# Patient Record
Sex: Female | Born: 1935 | Race: White | Hispanic: No | Smoking: Never smoker
Health system: Southern US, Community
[De-identification: ages and names within clinical notes are randomized; demographics above are authoritative.]

## PROBLEM LIST (undated history)

## (undated) ENCOUNTER — Emergency Department (HOSPITAL_COMMUNITY): Admission: EM | Payer: Medicare Other | Source: Home / Self Care

## (undated) DIAGNOSIS — E079 Disorder of thyroid, unspecified: Secondary | ICD-10-CM

## (undated) DIAGNOSIS — I10 Essential (primary) hypertension: Secondary | ICD-10-CM

## (undated) DIAGNOSIS — C801 Malignant (primary) neoplasm, unspecified: Secondary | ICD-10-CM

## (undated) DIAGNOSIS — N200 Calculus of kidney: Secondary | ICD-10-CM

## (undated) DIAGNOSIS — F419 Anxiety disorder, unspecified: Secondary | ICD-10-CM

## (undated) DIAGNOSIS — J302 Other seasonal allergic rhinitis: Secondary | ICD-10-CM

## (undated) DIAGNOSIS — M1990 Unspecified osteoarthritis, unspecified site: Secondary | ICD-10-CM

## (undated) DIAGNOSIS — R42 Dizziness and giddiness: Secondary | ICD-10-CM

## (undated) HISTORY — DX: Unspecified osteoarthritis, unspecified site: M19.90

## (undated) HISTORY — PX: GALLBLADDER SURGERY: SHX652

## (undated) HISTORY — DX: Calculus of kidney: N20.0

## (undated) HISTORY — DX: Anxiety disorder, unspecified: F41.9

## (undated) HISTORY — DX: Other seasonal allergic rhinitis: J30.2

## (undated) HISTORY — DX: Disorder of thyroid, unspecified: E07.9

## (undated) HISTORY — DX: Dizziness and giddiness: R42

## (undated) HISTORY — PX: WRIST FRACTURE SURGERY: SHX121

## (undated) HISTORY — DX: Essential (primary) hypertension: I10

## (undated) HISTORY — PX: OTHER SURGICAL HISTORY: SHX169

---

## 2007-03-02 ENCOUNTER — Emergency Department (HOSPITAL_COMMUNITY): Admission: EM | Admit: 2007-03-02 | Discharge: 2007-03-02 | Payer: Self-pay | Admitting: Emergency Medicine

## 2011-05-03 ENCOUNTER — Inpatient Hospital Stay (HOSPITAL_COMMUNITY)
Admission: EM | Admit: 2011-05-03 | Discharge: 2011-05-05 | DRG: 512 | Disposition: A | Discharge status: Home / Self Care | Payer: Medicare Other | Attending: Orthopedic Surgery | Admitting: Orthopedic Surgery

## 2011-05-03 ENCOUNTER — Emergency Department (HOSPITAL_COMMUNITY): Payer: Medicare Other

## 2011-05-03 DIAGNOSIS — E039 Hypothyroidism, unspecified: Secondary | ICD-10-CM | POA: Diagnosis present

## 2011-05-03 DIAGNOSIS — W19XXXA Unspecified fall, initial encounter: Secondary | ICD-10-CM | POA: Diagnosis present

## 2011-05-03 DIAGNOSIS — S52599A Other fractures of lower end of unspecified radius, initial encounter for closed fracture: Principal | ICD-10-CM | POA: Diagnosis present

## 2011-05-03 DIAGNOSIS — S52609B Unspecified fracture of lower end of unspecified ulna, initial encounter for open fracture type I or II: Secondary | ICD-10-CM | POA: Diagnosis present

## 2011-05-03 LAB — BASIC METABOLIC PANEL
CO2: 27 mEq/L (ref 19–32)
Calcium: 10.3 mg/dL (ref 8.4–10.5)
Creatinine, Ser: 1.33 mg/dL — ABNORMAL HIGH (ref 0.4–1.2)
GFR calc non Af Amer: 37 mL/min — ABNORMAL LOW (ref >60)
Sodium: 135 mEq/L (ref 135–145)

## 2011-05-03 LAB — DIFFERENTIAL
Basophils Relative: 0 % (ref 0–1)
Eosinophils Absolute: 0 10*3/uL (ref 0.0–0.7)
Eosinophils Relative: 0 % (ref 0–5)
Lymphs Abs: 1.7 10*3/uL (ref 0.7–4.0)
Monocytes Absolute: 0.4 10*3/uL (ref 0.1–1.0)
Monocytes Relative: 5 % (ref 3–12)

## 2011-05-03 LAB — CBC
MCH: 31.5 pg (ref 26.0–34.0)
MCHC: 33.2 g/dL (ref 30.0–36.0)
MCV: 94.9 fL (ref 78.0–100.0)
Platelets: 224 10*3/uL (ref 150–400)
RBC: 3.55 MIL/uL — ABNORMAL LOW (ref 3.87–5.11)
RDW: 13.1 % (ref 11.5–15.5)

## 2011-05-04 ENCOUNTER — Inpatient Hospital Stay (HOSPITAL_COMMUNITY): Payer: Medicare Other

## 2011-05-04 DIAGNOSIS — S52209A Unspecified fracture of shaft of unspecified ulna, initial encounter for closed fracture: Secondary | ICD-10-CM

## 2011-05-04 DIAGNOSIS — S52539A Colles' fracture of unspecified radius, initial encounter for closed fracture: Secondary | ICD-10-CM

## 2011-05-04 LAB — SURGICAL PCR SCREEN: MRSA, PCR: NEGATIVE

## 2011-05-06 NOTE — H&P (Signed)
NAME:  Amanda Prince, Amanda Prince                ACCOUNT NO.:  [id number]  MEDICAL RECORD NO.:  [id number]  LOCATION:  [facility]                          FACILITY:  [facility]  PHYSICIAN:  Vickki Hearing, M.D.DATE OF BIRTH:  1917-06-22  DATE OF ADMISSION:  05/03/2011 DATE OF DISCHARGE:  LH                             HISTORY & PHYSICAL   CHIEF COMPLAINT:  Pain, right wrist.  HISTORY:  This is a 77 year old female, tripped and fell earlier this evening fracturing her right distal radius.  There was a puncture wound over the ulna suggesting open fracture.  There is deformity of the right wrist.  Radiographs show a comminuted fracture of the distal radius with an ulnar fracture and significant displacement and impaction.  The history is notable for history of arthritis, gallstones, hypothyroidism, kidney stones, vertigo.  SOCIAL HISTORY:  Benign.  Family history of hypertension.  SURGICAL HISTORY:  Kidney stones and cholecystectomy.  MEDICINES:  Diovan, Synthroid, vitamin B6 and 12, Advil, loratadine, meclizine, Ativan, Allegra.  She has a primary care physician in [place].  She lives alone.  REVIEW OF SYSTEMS:  She says she has been healthy, does not go to the doctor that often and feels fine.  She is active at home.  PHYSICAL EXAMINATION:  VITAL SIGNS:  Temperature 98, pulse 84, respiratory rate 18, initial blood pressure is 161/67, suggesting high systolic blood pressure secondary to pain. GENERAL:  She is small-frame, well developed and well nourished, grooming and hygiene are normal. CARDIOVASCULAR:  Also normal. She has good perfusion to the right hand, good color and capillary refill are noted.  Normal radial and ulnar pulses are noted. SKIN:  There is a puncture wound over the ulna suggesting open fracture, although no bone is protruding. LYMPH SYSTEM:  Apparently negative with no obvious lymphangitis or palpable lymph nodes. NEUROLOGIC:  She is awake, alert and oriented.  Mood  and affect are normal.  She has normal gross sensory exam.  No focal findings has some numbness and tingling in the hand, but no evidence of nerve injury or carpal tunnel syndrome or compression.  No other injuries are noted, the other extremities are normally aligned without contracture, subluxation, atrophy, or tremor.  The right wrist is severely deformed.  There was a puncture wound over the ulna.  There is abnormal joint motion with pain.  There is crepitance and palpable tenderness at the fracture site.  Muscle tone is normal.  Elbow and shoulder joint subluxation none.  X-rays show a comminuted intra-articular fracture of the distal radius, fracture of the distal ulna, impaction osteopenia, suggesting osteoporosis, severe deformity.  In the emergency room, we did a hematoma block with 10 mL of plain 1% lidocaine.  We did a closed reduction.  We placed a sugar-tong splint. We dressed the wound with a sterile dressing.  She was started on Ancef 1 g.  We will continue that q.8 hours overnight.  I have indicated that she will need open treatment internal fixation of the wrist fracture, possible pinning or plating of the ulna fracture and also will require incision and drainage and irrigation debridement of the ulna wound.  Her initial lab counts are hemoglobin 11.2, platelet  count 224.  Sodium 135, potassium 4.4, glucose 113, BUN 17, creatinine 1.33.  She had a EKG which showed normal sinus rhythm.  She has a chest x-ray pending.  She will be admitted, continued on IV antibiotics, and have surgery in the morning.  The surgery was explained to the patient.  I do not think there is an option of close treatment with this severely unstable fracture pattern and the wound and they agree, informed consent was done in the ER with the family members present.     Vickki Hearing, M.D.     SEH/MEDQ  D:  05/03/2011  T:  05/03/2011  Job:  [id number]  Electronically Signed by  Fuller Canada M.D. on 05/06/2011 12:52:35 PM

## 2011-05-06 NOTE — Op Note (Signed)
Amanda Prince, Amanda Prince                ACCOUNT NO.:  [id number]  MEDICAL RECORD NO.:  [id number]  LOCATION:  A307                          FACILITY:  [facility]  PHYSICIAN:  Vickki Hearing, M.D.DATE OF BIRTH:  21-Oct-1917  DATE OF PROCEDURE: DATE OF DISCHARGE:                              OPERATIVE REPORT   DATE OF INJURY:  May 03, 2011.  PREOPERATIVE DIAGNOSES: 1. Closed fracture, right distal radius. 2. Open fracture, right distal ulna, grade 1.  POSTOPERATIVE DIAGNOSES: 1. Closed fracture, right distal radius. 2. Open fracture, right distal ulna, grade 1.  PROCEDURES: 1. Open treatment internal fixation, right distal radius with DVR     DePuy volar plate. 2. Incision and drainage, irrigation debridement, right distal ulna.  SURGEON:  Vickki Hearing, MD  ASSISTANTS:  None.  ANESTHESIA:  General with intubation.  OPERATIVE FINDINGS: 1. Comminuted, displaced, angulated, impacted right distal radius     fracture with osteopenia. 2. Inside out puncture wound, distal ulna with fracture, angulation,     and displacement.  This 77 year old female fell on May 03, 2011, injured her wrist, had severe deformity.  She had a closed reduction in the emergency room, wound was cleaned and treated.  She was started on antibiotics, it was an inside out puncture wound and delayed treatment was deemed appropriate.  She was brought to the operating room for general anesthesia.  She was already on Ancef 1 g q.8 hours.  She had the general anesthetic with slight difficulty which did require fiberoptic intubation and then smooth intubation was performed.  Her right arm was prepped with Betadine, draped sterilely.  Time-out was completed.  First, a closed reduction was attempted and radiographs confirmed that the fracture could be reduced with indirect means.  We then elevated the tourniquet to 250 mmHg where it stayed for 65 minutes.  A 2-0 K-wire was placed from the radial  styloid to the radial shaft. Repeat films showed the fracture to be in good position.  Volar approach to the distal radius was performed.  Subcutaneous tissue was divided. FCR sheath was opened.  FCR tendon was retracted ulnarly.  Blunt dissection was carried down to the pronator quadratus which was elevated from the radius and reflected ulnarly.  Watershed line was found and the plate was placed after a step-cut release of the brachioradialis.  A 2.5 drill bit was used to make a hole in the radius and we measured the screw for size 12 screw.  We placed that in the center of the slotted hole.  Radiograph was taken after the first proximal ulnar.  Pin was placed using a 2.0 drill bit depth gauge per technique.  Subchondral position was noted and we completed the ulnar row, took x-rays, then completed the distal row, took x-rays, completed the remaining screws in the shaft, and then placed a multidirectional screw under C-arm guidance. Radiographic C-arm films in AP lateral and oblique position showed the fracture to be in good position.  Hardware in good position. We then irrigated the wound and closed with 2-0 Monocryl and staples.  We then turned our attention to the puncture wound over the distal ulna. We have extended the wound  proximally and distally, debrided the fracture and bone and irrigated it and then partially closed it and packed it with a wet-to-dry dressing.  We placed a volar splint with the hand and wrist in a slightly extended position with the MP joints free.  Tourniquet was released.  Her hand had good color.  We injected a total of 20 mL of Marcaine with epinephrine in the forearm area and none in the hand.  POSTOPERATIVE PLAN:  The patient will be admitted, started on her preadmission medications including her Diovan.  She will get some respiratory treatments.  We will monitor her throat secondary to the somewhat difficult intubation and then she should be able to  be discharged on Sunday.  LONG-TERM PLAN:  She will knee wet-to-dry dressing changes on the distal ulnar wound.  She will need continuous antibiotics for 14 days p.o.  She will need a volar splint made when the staples come out at approximately 2 weeks, at which time we will also take a radiograph.     Vickki Hearing, M.D.     SEH/MEDQ  D:  05/04/2011  T:  05/04/2011  Job:  [id number]  Electronically Signed by Fuller Canada M.D. on 05/06/2011 12:52:37 PM

## 2011-05-06 NOTE — Discharge Summary (Signed)
  NAMEJESSLY, Amanda Prince                ACCOUNT NO.:  [id number]  MEDICAL RECORD NO.:  [id number]  LOCATION:  A307                          FACILITY:  [facility]  PHYSICIAN:  Vickki Hearing, M.D.DATE OF BIRTH:  [birth year]  DATE OF ADMISSION:  05/03/2011 DATE OF DISCHARGE:  06/10/2012LH                              DISCHARGE SUMMARY   ADMITTING DIAGNOSIS:  Closed fracture right distal radius, open fracture grade 1 right distal ulna.  DISCHARGE DIAGNOSIS:  Closed fracture right distal radius, open fracture grade 1 right distal ulna.  PROCEDURE:  Open treatment internal fixation right distal radius with DVR DePuy volar plate.  SECOND PROCEDURE:  Incision and drainage, irrigation, and debridement of the right distal ulna.  DATE OF SURGERY:  On May 04, 2011.  SURGEON:  Vickki Hearing, MD  ANESTHETIC:  General.  OPERATIVE FINDINGS:  Comminuted displaced angulated impacted right distal radius fracture with osteopenia, inside out puncture wound distal ulna with fracture and angulation and displacement.  HISTORY:  A 77 year old female who fell on May 03, 2011, injured her right wrist, presented to the emergency room with severe deformity and a puncture wound from inside out over the ulnar, this was treated with wound care and starting antibiotics.  And then she was placed in a splint after closed reduction.  She was then taken to the operating room on June 9, had open treatment, internal fixation and irrigation, debridement as stated.  She will be discharged home.  She is in stable condition.  Vital signs are stable.  She has a pain level of 0.  She is discharged home on; 1. Norco 5 mg 1 tablet q.4 h. p.r.n. pain, #42, 1 refill. 2. She is on Keflex 500 mg 1 q.8 h. for 2 weeks with 1 refill. 3. She is on Advil 200 mg twice daily. 4. Allegra 180 mg daily. 5. Artificial tears 1 drop both eyes 4 times daily as needed. 6. Ativan 1 mg daily. 7. Diovan HCT 8/12.5 one daily. 8.  Loratadine 10 mg daily. 9. Meclizine 1 tablet daily as needed for dizziness. 10.MiraLax for constipation 17 g as needed. 11.Synthroid 88 mcg 1 tablet daily. 12.Vitamin B12 1 tablet by mouth daily. 13.Vitamin B6 1 tablet by mouth daily.  Her follow up will be on Wednesday, June 13 at 10 a.m. for dressing change and splint application.  Her allergies are to SULFA.  Her disposition is home.  Her overall condition is stable and improved.     Vickki Hearing, M.D.     SEH/MEDQ  D:  05/05/2011  T:  05/06/2011  Job:  [id number]  Electronically Signed by Fuller Canada M.D. on 05/06/2011 12:52:39 PM

## 2011-05-08 ENCOUNTER — Encounter: Payer: Self-pay | Admitting: Orthopedic Surgery

## 2011-05-08 ENCOUNTER — Telehealth: Payer: Self-pay | Admitting: *Deleted

## 2011-05-08 ENCOUNTER — Ambulatory Visit (INDEPENDENT_AMBULATORY_CARE_PROVIDER_SITE_OTHER): Payer: Medicare Other | Admitting: Orthopedic Surgery

## 2011-05-08 DIAGNOSIS — S62109A Fracture of unspecified carpal bone, unspecified wrist, initial encounter for closed fracture: Secondary | ICD-10-CM

## 2011-05-08 DIAGNOSIS — S62109B Fracture of unspecified carpal bone, unspecified wrist, initial encounter for open fracture: Secondary | ICD-10-CM | POA: Insufficient documentation

## 2011-05-08 NOTE — Telephone Encounter (Signed)
Called and stated that patients Antibiotic is Cephalexin 500mg  1 po every 8 hrs, she was advised today in our office to call to give [place] this information.

## 2011-05-08 NOTE — Progress Notes (Signed)
Postoperative visit #1  Postoperative day #4  Procedure open treatment internal fixation RIGHT distal radius with volar plate Procedure incision drainage irrigation debridement RIGHT distal ulna  Date of surgery June 9  Today the patient is brought in for a cast change/splint change wound check  Wounds look good wet-to-dry dressing placed on the ulnar wound which is clean and dry with good granulation bed  New splint was applied  Return to stop a 13 or so for staples out wound check x-rays and order removable splint

## 2011-05-16 ENCOUNTER — Ambulatory Visit (INDEPENDENT_AMBULATORY_CARE_PROVIDER_SITE_OTHER): Payer: Medicare Other | Admitting: Orthopedic Surgery

## 2011-05-16 DIAGNOSIS — Z9889 Other specified postprocedural states: Secondary | ICD-10-CM

## 2011-05-16 NOTE — Progress Notes (Signed)
Postoperative visit # 2 Postoperative day # 12 Procedure open treatment internal fixation RIGHT distal radius with volar plate  Procedure incision drainage irrigation debridement RIGHT distal ulna  Date of surgery June 9  Today:   X-rays and staples out

## 2011-05-16 NOTE — Patient Instructions (Signed)
Keep  Cast dry   Do not get wet   If it gets wet dry with a hair dryer on low setting and call the office   

## 2011-05-16 NOTE — Progress Notes (Signed)
X-ray report.  3 views, RIGHT wrist with specialized lunate fossa.  Previously noted distal radius fracture has been restored. The length. Angulation looks good. Position looks good.  Impression healing distal radius fracture with good alignment and hardware intact

## 2011-06-13 ENCOUNTER — Ambulatory Visit: Payer: Medicare Other | Admitting: Orthopedic Surgery

## 2011-06-13 DIAGNOSIS — S52539A Colles' fracture of unspecified radius, initial encounter for closed fracture: Secondary | ICD-10-CM

## 2011-06-13 DIAGNOSIS — S52531A Colles' fracture of right radius, initial encounter for closed fracture: Secondary | ICD-10-CM

## 2011-06-13 NOTE — Progress Notes (Signed)
Postoperative visit # 3  Postoperative day # 40 Procedure open treatment internal fixation RIGHT distal radius with volar plate  Procedure incision drainage irrigation debridement RIGHT distal ulna  Date of surgery June 9  Today for x-rays:   Separate x-ray report.  Reason for x-ray, fracture, care, followup, postop.  3 views of the RIGHT wrist. There is a volar plate over a distal radius and ulnar fracture with near-anatomic alignment. The joint surface alignment.  Impression healing RIGHT distal radius fracture.  The wounds look good.  Patient is placed in a volar splint with Velcro straps.  Followup one month. X-rays RIGHT wrist

## 2011-07-16 ENCOUNTER — Ambulatory Visit (INDEPENDENT_AMBULATORY_CARE_PROVIDER_SITE_OTHER): Payer: Medicare Other | Admitting: Orthopedic Surgery

## 2011-07-16 DIAGNOSIS — S62109A Fracture of unspecified carpal bone, unspecified wrist, initial encounter for closed fracture: Secondary | ICD-10-CM

## 2011-07-16 NOTE — Progress Notes (Signed)
Postoperative visit # 4 Postoperative day # 73 Procedure open treatment internal fixation RIGHT distal radius with volar plate [DVR] Procedure incision drainage irrigation debridement RIGHT distal ulna  Date of surgery June 9  Today for x-rays 3 views are obtained fracture has healed for the most part.  The ulnar fracture appears to still be with a fibrous union.  Overall alignment is acceptable.  We did get a good radial inclination angle.  The AP today is slightly oblique but shows the fracture healing.  Oblique x-rays were obtained as well.  The lateral view shows good angulation of the articular surface  The x-rays also show that the multidirectional screw may or may not be in bone.  The clinical exam shows she has weak grip strength good extension good flexion good pronation and supination incisions are healed no tenderness at the fracture  Recommend OT.  Come back in 6 weeks  3 views of the RIGHT wrist Fractured RIGHT wrist status post open treatment internal fixation  2 fractures are noted one in the radius alignment near anatomic, one in the ulna with fibrous union alignment normal.  Impression healed distal radius fracture

## 2011-07-16 NOTE — Patient Instructions (Signed)
Go for therapy in [place]  Come back in 6 weeks to recheck ROM.

## 2011-08-28 ENCOUNTER — Ambulatory Visit (INDEPENDENT_AMBULATORY_CARE_PROVIDER_SITE_OTHER): Payer: Medicare Other | Admitting: Orthopedic Surgery

## 2011-08-28 ENCOUNTER — Ambulatory Visit: Payer: Medicare Other | Admitting: Orthopedic Surgery

## 2011-08-28 ENCOUNTER — Encounter: Payer: Self-pay | Admitting: Orthopedic Surgery

## 2011-08-28 DIAGNOSIS — Z9889 Other specified postprocedural states: Secondary | ICD-10-CM

## 2011-08-28 DIAGNOSIS — S62109B Fracture of unspecified carpal bone, unspecified wrist, initial encounter for open fracture: Secondary | ICD-10-CM

## 2011-08-28 DIAGNOSIS — S62109A Fracture of unspecified carpal bone, unspecified wrist, initial encounter for closed fracture: Secondary | ICD-10-CM

## 2011-08-28 NOTE — Progress Notes (Signed)
Postoperative: Procedure open treatment internal fixation RIGHT distal radius with volar plate [DVR]  Procedure incision drainage irrigation debridement RIGHT distal ulna  Date of surgery June 9  Status post occupational therapy and exsanguinated care. Physical therapy. She is doing well completed the therapy. She has near full motion and near full strength of her hand. She is able to activities at home.  Her wound looks clean.  She is discharged with followup as needed

## 2011-08-28 NOTE — Patient Instructions (Signed)
Resume normal activity

## 2014-09-05 ENCOUNTER — Telehealth: Payer: Self-pay | Admitting: Orthopedic Surgery

## 2014-09-05 NOTE — Telephone Encounter (Signed)
Patient's daughter had called initially, this morning, inquiring about a same day appointment today, 09/05/14, for problem hip pain radiating down right leg. Our office had relayed that Dr Aline Brochure is out for some of today; therefore, no immediate appointments, and had advised Emergency Room or primary care.  Call received this afternoon from primary care, Dr. Danelle Berry office, per Manuela Schwartz  - Patient did go to her doctor's office this afternoon, and they are referring her for this problem; no Xrays have been done as of yet.  Per Dr Aline Brochure, schedule first available, Monday, 09/12/14.   - I called patient and spoke with daughter, [name], at ph# [phone]; states to go ahead and schedule, however, may need to call back and cancel if she finds the need to have her mother seen sooner.

## 2014-09-07 NOTE — Telephone Encounter (Signed)
Patient's daughter called back to relay that patient has been referred by her primary care to [facility], and the appointment is for today, 09/07/14.  Her upcoming appointment here has therefore been cancelled.

## 2014-09-12 ENCOUNTER — Ambulatory Visit: Payer: Medicare Other | Admitting: Orthopedic Surgery

## 2015-07-18 ENCOUNTER — Inpatient Hospital Stay (HOSPITAL_COMMUNITY)
Admission: EM | Admit: 2015-07-18 | Discharge: 2015-07-21 | DRG: 481 | Disposition: A | Discharge status: Skilled Nursing Facility | Payer: Medicare Other | Attending: Internal Medicine | Admitting: Internal Medicine

## 2015-07-18 ENCOUNTER — Emergency Department (HOSPITAL_COMMUNITY): Payer: Medicare Other

## 2015-07-18 ENCOUNTER — Encounter (HOSPITAL_COMMUNITY): Payer: Self-pay | Admitting: Emergency Medicine

## 2015-07-18 DIAGNOSIS — N183 Chronic kidney disease, stage 3 (moderate): Secondary | ICD-10-CM | POA: Diagnosis present

## 2015-07-18 DIAGNOSIS — E039 Hypothyroidism, unspecified: Secondary | ICD-10-CM | POA: Diagnosis present

## 2015-07-18 DIAGNOSIS — Y92009 Unspecified place in unspecified non-institutional (private) residence as the place of occurrence of the external cause: Secondary | ICD-10-CM | POA: Diagnosis not present

## 2015-07-18 DIAGNOSIS — I129 Hypertensive chronic kidney disease with stage 1 through stage 4 chronic kidney disease, or unspecified chronic kidney disease: Secondary | ICD-10-CM | POA: Diagnosis present

## 2015-07-18 DIAGNOSIS — S72141A Displaced intertrochanteric fracture of right femur, initial encounter for closed fracture: Secondary | ICD-10-CM | POA: Diagnosis present

## 2015-07-18 DIAGNOSIS — S72009A Fracture of unspecified part of neck of unspecified femur, initial encounter for closed fracture: Secondary | ICD-10-CM

## 2015-07-18 DIAGNOSIS — D649 Anemia, unspecified: Secondary | ICD-10-CM | POA: Diagnosis present

## 2015-07-18 DIAGNOSIS — S72001A Fracture of unspecified part of neck of right femur, initial encounter for closed fracture: Secondary | ICD-10-CM | POA: Diagnosis not present

## 2015-07-18 DIAGNOSIS — W010XXA Fall on same level from slipping, tripping and stumbling without subsequent striking against object, initial encounter: Secondary | ICD-10-CM | POA: Diagnosis present

## 2015-07-18 DIAGNOSIS — I10 Essential (primary) hypertension: Secondary | ICD-10-CM | POA: Diagnosis present

## 2015-07-18 DIAGNOSIS — Z01811 Encounter for preprocedural respiratory examination: Secondary | ICD-10-CM

## 2015-07-18 DIAGNOSIS — M25551 Pain in right hip: Secondary | ICD-10-CM | POA: Diagnosis present

## 2015-07-18 DIAGNOSIS — R52 Pain, unspecified: Secondary | ICD-10-CM

## 2015-07-18 DIAGNOSIS — E038 Other specified hypothyroidism: Secondary | ICD-10-CM | POA: Diagnosis not present

## 2015-07-18 DIAGNOSIS — M1990 Unspecified osteoarthritis, unspecified site: Secondary | ICD-10-CM | POA: Diagnosis present

## 2015-07-18 DIAGNOSIS — N179 Acute kidney failure, unspecified: Secondary | ICD-10-CM | POA: Diagnosis present

## 2015-07-18 DIAGNOSIS — N289 Disorder of kidney and ureter, unspecified: Secondary | ICD-10-CM

## 2015-07-18 LAB — BASIC METABOLIC PANEL
ANION GAP: 9 (ref 5–15)
BUN: 28 mg/dL — ABNORMAL HIGH (ref 6–20)
CALCIUM: 9.2 mg/dL (ref 8.9–10.3)
CO2: 25 mmol/L (ref 22–32)
CREATININE: 1.54 mg/dL — AB (ref 0.44–1.00)
Chloride: 105 mmol/L (ref 101–111)
GFR, EST AFRICAN AMERICAN: 32 mL/min — AB (ref >60)
GFR, EST NON AFRICAN AMERICAN: 28 mL/min — AB (ref >60)
GLUCOSE: 117 mg/dL — AB (ref 65–99)
Potassium: 4.4 mmol/L (ref 3.5–5.1)
Sodium: 139 mmol/L (ref 135–145)

## 2015-07-18 LAB — CBC WITH DIFFERENTIAL/PLATELET
BASOS ABS: 0 10*3/uL (ref 0.0–0.1)
BASOS PCT: 0 % (ref 0–1)
EOS PCT: 2 % (ref 0–5)
Eosinophils Absolute: 0.1 10*3/uL (ref 0.0–0.7)
HCT: 30.3 % — ABNORMAL LOW (ref 36.0–46.0)
Hemoglobin: 9.9 g/dL — ABNORMAL LOW (ref 12.0–15.0)
Lymphocytes Relative: 16 % (ref 12–46)
Lymphs Abs: 1.3 10*3/uL (ref 0.7–4.0)
MCH: 32.4 pg (ref 26.0–34.0)
MCHC: 32.7 g/dL (ref 30.0–36.0)
MCV: 99 fL (ref 78.0–100.0)
MONO ABS: 0.5 10*3/uL (ref 0.1–1.0)
MONOS PCT: 6 % (ref 3–12)
Neutro Abs: 6.4 10*3/uL (ref 1.7–7.7)
Neutrophils Relative %: 76 % (ref 43–77)
PLATELETS: 257 10*3/uL (ref 150–400)
RBC: 3.06 MIL/uL — ABNORMAL LOW (ref 3.87–5.11)
RDW: 12.4 % (ref 11.5–15.5)
WBC: 8.3 10*3/uL (ref 4.0–10.5)

## 2015-07-18 LAB — URINALYSIS, ROUTINE W REFLEX MICROSCOPIC
BILIRUBIN URINE: NEGATIVE
GLUCOSE, UA: NEGATIVE mg/dL
HGB URINE DIPSTICK: NEGATIVE
KETONES UR: NEGATIVE mg/dL
LEUKOCYTES UA: NEGATIVE
Nitrite: NEGATIVE
PH: 5.5 (ref 5.0–8.0)
PROTEIN: NEGATIVE mg/dL
Specific Gravity, Urine: 1.015 (ref 1.005–1.030)
Urobilinogen, UA: 0.2 mg/dL (ref 0.0–1.0)

## 2015-07-18 NOTE — ED Provider Notes (Signed)
CSN: [id number]     Arrival date & time 07/18/15  2120 History  This chart was scribed for Amanda Fuel, MD by Helane Gunther, ED Scribe. This patient was seen in room [facility]18/[facility]18 and the patient's care was started at 10:02 PM.     Chief Complaint  Patient presents with  . Fall   The history is provided by the patient. No language interpreter was used.   HPI Comments: Amanda Prince is a 81 y.o. female who presents to the Emergency Department complaining of a fall that occurred earlier today. She states she tripped over her own feet and fell. She was unable to get up after the fall. She notes associated left groin pain. Pt denies LOC or head trauma.   Past Medical History  Diagnosis Date  . HTN (hypertension)   . Thyroid disease   . Seasonal allergies   . Vertigo   . Anxiety   . Kidney stone   . Arthritis    Past Surgical History  Procedure Laterality Date  . Kidney stones    . Gallbladder surgery    . Otif right wrist  Dr Aline Brochure 2012  . Wrist fracture surgery     Family History  Problem Relation Age of Onset  . Heart disease     Social History  Substance Use Topics  . Smoking status: Never Smoker   . Smokeless tobacco: None  . Alcohol Use: No   OB History    No data available     Review of Systems  Musculoskeletal: Positive for myalgias and arthralgias.  All other systems reviewed and are negative.   Allergies  Sulfa antibiotics  Home Medications   Prior to Admission medications   Medication Sig Start Date End Date Taking? Authorizing Provider  Cyanocobalamin (VITAMIN B12 PO) Take by mouth.      Historical Provider, MD  fexofenadine (ALLEGRA) 180 MG tablet Take 180 mg by mouth daily.      Historical Provider, MD  levothyroxine (SYNTHROID, LEVOTHROID) 88 MCG tablet Take 88 mcg by mouth daily.      Historical Provider, MD  loratadine (CLARITIN) 10 MG tablet Take 10 mg by mouth daily.      Historical Provider, MD  LORazepam (ATIVAN) 1 MG tablet Take 1 mg by  mouth every 8 (eight) hours.      Historical Provider, MD  meclizine (ANTIVERT) 32 MG tablet Take 32 mg by mouth 3 (three) times daily as needed.      Historical Provider, MD  Polyethylene Glycol 3350 (MIRALAX PO) Take by mouth.      Historical Provider, MD  Pyridoxine HCl (VITAMIN B6 PO) Take by mouth.      Historical Provider, MD  valsartan-hydrochlorothiazide (DIOVAN-HCT) 80-12.5 MG per tablet Take 1 tablet by mouth daily.      Historical Provider, MD   BP 155/63 mmHg  Pulse 86  Temp(Src) 98.1 F (36.7 C)  Resp 20  Ht 5\' 5"  (1.651 m)  Wt 115 lb (52.164 kg)  BMI 19.14 kg/m2  SpO2 99% Physical Exam  Constitutional: She is oriented to person, place, and time. She appears well-developed and well-nourished.  HENT:  Head: Normocephalic and atraumatic.  Eyes: Conjunctivae are normal. Pupils are equal, round, and reactive to light. Right eye exhibits no discharge. Left eye exhibits no discharge.  Neck: Normal range of motion. Neck supple. No JVD present.  Cardiovascular: Normal rate, regular rhythm and normal heart sounds.   No murmur heard. Pulmonary/Chest: Effort normal and breath sounds  normal. She has no wheezes. She has no rales. She exhibits no tenderness.  Abdominal: Soft. Bowel sounds are normal. She exhibits no distension and no mass. There is no tenderness.  Musculoskeletal: She exhibits tenderness. She exhibits no edema.  R leg is shortened and externally rotated. Moderate TTP R hip. Marked pain with any passive ROM of the right hip.  Lymphadenopathy:    She has no cervical adenopathy.  Neurological: She is alert and oriented to person, place, and time. No cranial nerve deficit. She exhibits normal muscle tone. Coordination normal.  Skin: Skin is warm and dry. No rash noted. She is not diaphoretic.  Psychiatric: She has a normal mood and affect. Her behavior is normal. Judgment and thought content normal.  Nursing note and vitals reviewed.   ED Course  Procedures   DIAGNOSTIC STUDIES: Oxygen Saturation is 99% on RA, normal by my interpretation.    COORDINATION OF CARE: 10:10 PM - Discussed treating as broken hip until it can be r/o conclusively. Discussed plans to order diagnostic studies and imaging, as well as catheterization. Pt advised of plan for treatment and pt agrees.  Labs Review Labs Reviewed  CBC WITH DIFFERENTIAL/PLATELET - Abnormal; Notable for the following:    RBC 3.06 (*)    Hemoglobin 9.9 (*)    HCT 30.3 (*)    All other components within normal limits  BASIC METABOLIC PANEL - Abnormal; Notable for the following:    Glucose, Bld 117 (*)    BUN 28 (*)    Creatinine, Ser 1.54 (*)    GFR calc non Af Amer 28 (*)    GFR calc Af Amer 32 (*)    All other components within normal limits  URINALYSIS, ROUTINE W REFLEX MICROSCOPIC (NOT AT [facility])  TYPE AND SCREEN    Imaging Review Dg Chest 1 View  07/18/2015   CLINICAL DATA:  Trip and fall injury tonight. Preoperative examination.  EXAM: CHEST  1 VIEW  COMPARISON:  05/03/2011  FINDINGS: Shallow inspiration with elevation of the right hemidiaphragm. Heart size and pulmonary vascularity are normal. Diffuse interstitial pattern to the lungs may represent fibrosis or chronic bronchitic changes. No blunting of costophrenic angles. No pneumothorax. No focal consolidation. Calcification of the aorta.  IMPRESSION: Interstitial pattern to the lungs, likely chronic. No evidence of active pulmonary disease.   Electronically Signed   By: Lucienne Capers M.D.   On: 07/18/2015 23:06   Dg Hip Unilat With Pelvis 2-3 Views Right  07/18/2015   CLINICAL DATA:  81 year old female post trip and fall, now with severe right hip pain.  EXAM: DG HIP (WITH OR WITHOUT PELVIS) 2-3V RIGHT  COMPARISON:  None.  FINDINGS: Mildly comminuted intertrochanteric right hip fracture with mild displacement involving the greater and lesser trochanters. Mild shortening and angulation. Femoral head remains seated in the acetabulum.  Remainder the bony pelvis is intact.  IMPRESSION: Displaced mildly comminuted intertrochanteric right hip fracture.   Electronically Signed   By: Jeb Levering M.D.   On: 07/18/2015 23:06   I have personally reviewed and evaluated these images and lab results as part of my medical decision-making.   EKG Interpretation   Date/Time:  Tuesday July 18 2015 22:21:52 EDT Ventricular Rate:  92 PR Interval:  172 QRS Duration: 87 QT Interval:  338 QTC Calculation: 418 R Axis:   -66 Text Interpretation:  Sinus rhythm Ventricular premature complex LAD,  consider left anterior fascicular block Low voltage, extremity leads  Baseline wander in lead(s) II III  aVF When compared with ECG of 05/03/2011,  Nonspecific ST and T wave abnormality is no longer Present Premature  ventricular complexes are now Present Low voltage QRS NOW PRESENT  Confirmed by Roxanne Mins  MD, Lunden Stieber ([id number]) on 07/18/2015 10:54:09 PM      MDM   Final diagnoses:  Closed intertrochanteric fracture of right hip, initial encounter  Fall from slip, trip, or stumble, initial encounter  Normochromic normocytic anemia  Renal insufficiency    Fall with clinical findings strongly suggestive of hip fracture. Laboratory, X Ray, ECG evaluation done anticipating need for admission for operative management. Hip x-ray confirms intertrochanteric hip fracture. Case is discussed with Dr. Aline Brochure who is on-call for orthopedic surgery. He states that he is not available for operative management tomorrow and will turn the case over to Dr. Luna Glasgow. Case is discussed with Dr. Marin Comment [facility] who agrees to admit the patient.  I personally performed the services described in this documentation, which was scribed in my presence. The recorded information has been reviewed and is accurate.     Amanda Fuel, MD [id number]

## 2015-07-18 NOTE — ED Notes (Addendum)
Pt states she tripped over her feet and c/o right hip pain.

## 2015-07-18 NOTE — H&P (Signed)
[facility] History and Physical  Amanda Prince LGX:[id number] DOB: 10/02/1917    PCP:   Clinton Quant, MD   Chief Complaint: Mechanical fall resulting in a right hip Fx.  HPI: Amanda Prince is an 81 y.o. female with hx of HTN, hypothyroidism, allergy, tripped over her feet and fell complaining of right hip pain.  X ray showed right displaced comminuted intertrochanteric Fx.  She has CKD and her current Cr is 1.5, slightly above her prior Cr of 1.33 several years ago.  Her CXR showed no infiltrate and her UA was negative.  Her Hb was 9.9 grams per dL.  EKG showed NSR with no acute ST T changes.  EDP spoke with Dr Aline Brochure or orthopedics, with plan to proceed with ORIF likely tomorrow, and hospitalist was asked to admit her for same.   Rewiew of Systems:  Constitutional: Negative for malaise, fever and chills. No significant weight loss or weight gain Eyes: Negative for eye pain, redness and discharge, diplopia, visual changes, or flashes of light. ENMT: Negative for ear pain, hoarseness, nasal congestion, sinus pressure and sore throat. No headaches; tinnitus, drooling, or problem swallowing. Cardiovascular: Negative for chest pain, palpitations, diaphoresis, dyspnea and peripheral edema. ; No orthopnea, PND Respiratory: Negative for cough, hemoptysis, wheezing and stridor. No pleuritic chestpain. Gastrointestinal: Negative for nausea, vomiting, diarrhea, constipation, abdominal pain, melena, blood in stool, hematemesis, jaundice and rectal bleeding.    Genitourinary: Negative for frequency, dysuria, incontinence,flank pain and hematuria; Musculoskeletal: Negative for back pain and neck pain. Negative for swelling and trauma.;  Skin: . Negative for pruritus, rash, abrasions, bruising and skin lesion.; ulcerations Neuro: Negative for headache, lightheadedness and neck stiffness. Negative for weakness, altered level of consciousness , altered mental status, extremity weakness,  burning feet, involuntary movement, seizure and syncope.  Psych: negative for anxiety, depression, insomnia, tearfulness, panic attacks, hallucinations, paranoia, suicidal or homicidal ideation   Past Medical History  Diagnosis Date  . HTN (hypertension)   . Thyroid disease   . Seasonal allergies   . Vertigo   . Anxiety   . Kidney stone   . Arthritis     Past Surgical History  Procedure Laterality Date  . Kidney stones    . Gallbladder surgery    . Otif right wrist  Dr Aline Brochure 2012  . Wrist fracture surgery      Medications:  HOME MEDS: Prior to Admission medications   Medication Sig Start Date End Date Taking? Authorizing Provider  Cyanocobalamin (VITAMIN B12 PO) Take by mouth.      Historical Provider, MD  fexofenadine (ALLEGRA) 180 MG tablet Take 180 mg by mouth daily.      Historical Provider, MD  levothyroxine (SYNTHROID, LEVOTHROID) 88 MCG tablet Take 88 mcg by mouth daily.      Historical Provider, MD  loratadine (CLARITIN) 10 MG tablet Take 10 mg by mouth daily.      Historical Provider, MD  LORazepam (ATIVAN) 1 MG tablet Take 1 mg by mouth every 8 (eight) hours.      Historical Provider, MD  meclizine (ANTIVERT) 32 MG tablet Take 32 mg by mouth 3 (three) times daily as needed.      Historical Provider, MD  Polyethylene Glycol 3350 (MIRALAX PO) Take by mouth.      Historical Provider, MD  Pyridoxine HCl (VITAMIN B6 PO) Take by mouth.      Historical Provider, MD  valsartan-hydrochlorothiazide (DIOVAN-HCT) 80-12.5 MG per tablet Take 1 tablet by mouth daily.  Historical Provider, MD     Allergies:  Allergies  Allergen Reactions  . Sulfa Antibiotics     Social History:   reports that she has never smoked. She does not have any smokeless tobacco history on file. She reports that she does not drink alcohol or use illicit drugs.  Family History: Family History  Problem Relation Age of Onset  . Heart disease       Physical Exam: Filed Vitals:   07/18/15  2122  BP: 155/63  Pulse: 86  Temp: 98.1 F (36.7 C)  Resp: 20  Height: 5\' 5"  (1.651 m)  Weight: 52.164 kg (115 lb)  SpO2: 99%   Blood pressure 155/63, pulse 86, temperature 98.1 F (36.7 C), resp. rate 20, height 5\' 5"  (1.651 m), weight 52.164 kg (115 lb), SpO2 99 %.  GEN:  Pleasant  patient lying in the stretcher in no acute distress; cooperative with exam. PSYCH:  alert and oriented x4; does not appear anxious or depressed; affect is appropriate. HEENT: Mucous membranes pink and anicteric; PERRLA; EOM intact; no cervical lymphadenopathy nor thyromegaly or carotid bruit; no JVD; There were no stridor. Neck is very supple. Breasts:: Not examined CHEST WALL: No tenderness CHEST: Normal respiration, clear to auscultation bilaterally.  HEART: Regular rate and rhythm.  There are no murmur, rub, or gallops.   BACK: No kyphosis or scoliosis; no CVA tenderness ABDOMEN: soft and non-tender; no masses, no organomegaly, normal abdominal bowel sounds; no pannus; no intertriginous candida. There is no rebound and no distention. Rectal Exam: Not done EXTREMITIES: No bone or joint deformity; age-appropriate arthropathy of the hands and knees; no edema; no ulcerations.  There is no calf tenderness. Did not exam her right hip.  Genitalia: not examined PULSES: 2+ and symmetric SKIN: Normal hydration no rash or ulceration CNS: Cranial nerves 2-12 grossly intact no focal lateralizing neurologic deficit.  Speech is fluent; uvula elevated with phonation, facial symmetry and tongue midline. DTR are normal bilaterally, cerebella exam is intact, barbinski is negative and strengths are equaled bilaterally.  No sensory loss.   Labs on Admission:  Basic Metabolic Panel:  Recent Labs Lab 07/18/15 2220  NA 139  K 4.4  CL 105  CO2 25  GLUCOSE 117*  BUN 28*  CREATININE 1.54*  CALCIUM 9.2  CBC:  Recent Labs Lab 07/18/15 2220  WBC 8.3  NEUTROABS 6.4  HGB 9.9*  HCT 30.3*  MCV 99.0  PLT 257     Radiological Exams on Admission: Dg Chest 1 View  07/18/2015   CLINICAL DATA:  Trip and fall injury tonight. Preoperative examination.  EXAM: CHEST  1 VIEW  COMPARISON:  05/03/2011  FINDINGS: Shallow inspiration with elevation of the right hemidiaphragm. Heart size and pulmonary vascularity are normal. Diffuse interstitial pattern to the lungs may represent fibrosis or chronic bronchitic changes. No blunting of costophrenic angles. No pneumothorax. No focal consolidation. Calcification of the aorta.  IMPRESSION: Interstitial pattern to the lungs, likely chronic. No evidence of active pulmonary disease.   Electronically Signed   By: Lucienne Capers M.D.   On: 07/18/2015 23:06   Dg Hip Unilat With Pelvis 2-3 Views Right  07/18/2015   CLINICAL DATA:  81 year old female post trip and fall, now with severe right hip pain.  EXAM: DG HIP (WITH OR WITHOUT PELVIS) 2-3V RIGHT  COMPARISON:  None.  FINDINGS: Mildly comminuted intertrochanteric right hip fracture with mild displacement involving the greater and lesser trochanters. Mild shortening and angulation. Femoral head remains seated in the  acetabulum. Remainder the bony pelvis is intact.  IMPRESSION: Displaced mildly comminuted intertrochanteric right hip fracture.   Electronically Signed   By: Jeb Levering M.D.   On: 07/18/2015 23:06    EKG: Independently reviewed.    Assessment/Plan Present on Admission:  . Hip fracture, right . Hypothyroidism . HTN (hypertension) . Hip fracture  PLAN:  Will admit her for right hip intertrochanteric Fx.  Accepting her moderate risk for peri Op cardiovascular complication, she should proceed with surgery.  Her cardiovascular status is stable and optimized, as she is rather asymptomatic.  Will give heparin SQ for DVT prophylaxis, and made NPO.  For her hypothyroidism, will continue with synthroid, and check TSH.  I discussed code status with her tonight, and with her 2 daughters at her bedside, and she would  like to be a FULL CODE.  She is stable, and will be admitted to [facility].  Orthopedics will see her in the morning, and Dr Aline Brochure is aware of her admission per EDP.   Other plans as per orders.  Code Status: FULL Haskel Khan, MD. [facility] Pager [phone] 7pm to 7am.  07/18/2015, 11:57 PM

## 2015-07-19 ENCOUNTER — Encounter (HOSPITAL_COMMUNITY)
Admission: EM | Disposition: A | Discharge status: Skilled Nursing Facility | Payer: Self-pay | Source: Home / Self Care | Attending: Internal Medicine

## 2015-07-19 ENCOUNTER — Inpatient Hospital Stay (HOSPITAL_COMMUNITY): Payer: Medicare Other

## 2015-07-19 ENCOUNTER — Encounter (HOSPITAL_COMMUNITY): Payer: Self-pay | Admitting: *Deleted

## 2015-07-19 ENCOUNTER — Inpatient Hospital Stay (HOSPITAL_COMMUNITY): Payer: Medicare Other | Admitting: Anesthesiology

## 2015-07-19 HISTORY — PX: ORIF HIP FRACTURE: SHX2125

## 2015-07-19 LAB — SURGICAL PCR SCREEN
MRSA, PCR: NEGATIVE
STAPHYLOCOCCUS AUREUS: NEGATIVE

## 2015-07-19 LAB — TSH: TSH: 0.861 u[IU]/mL (ref 0.350–4.500)

## 2015-07-19 LAB — ABO/RH: ABO/RH(D): O POS

## 2015-07-19 LAB — PREPARE RBC (CROSSMATCH)

## 2015-07-19 SURGERY — OPEN REDUCTION INTERNAL FIXATION HIP
Anesthesia: Spinal | Site: Hip | Laterality: Right

## 2015-07-19 MED ORDER — MORPHINE SULFATE (PF) 2 MG/ML IV SOLN
1.0000 mg | INTRAVENOUS | Status: DC | PRN
  Administered 2015-07-19 (×2): 1 mg via INTRAVENOUS
  Filled 2015-07-19 (×2): qty 1

## 2015-07-19 MED ORDER — ONDANSETRON HCL 4 MG/2ML IJ SOLN: 4.0000 mg | Freq: Three times a day (TID) | INTRAMUSCULAR | Status: DC | PRN

## 2015-07-19 MED ORDER — POVIDONE-IODINE 10 % EX SOLN
Freq: Once | CUTANEOUS | Status: AC
  Administered 2015-07-19: 09:00:00 via TOPICAL

## 2015-07-19 MED ORDER — ONDANSETRON HCL 4 MG/2ML IJ SOLN
INTRAMUSCULAR | Status: AC
  Filled 2015-07-19: qty 2

## 2015-07-19 MED ORDER — LACTATED RINGERS IV SOLN
INTRAVENOUS | Status: DC
  Administered 2015-07-19: 10:00:00 via INTRAVENOUS

## 2015-07-19 MED ORDER — ONDANSETRON HCL 4 MG/2ML IJ SOLN: 4.0000 mg | Freq: Four times a day (QID) | INTRAMUSCULAR | Status: DC | PRN

## 2015-07-19 MED ORDER — HYDROGEN PEROXIDE 3 % EX SOLN
CUTANEOUS | Status: DC | PRN
  Administered 2015-07-19: 1

## 2015-07-19 MED ORDER — PHENYLEPHRINE HCL 10 MG/ML IJ SOLN
INTRAMUSCULAR | Status: DC | PRN
  Administered 2015-07-19 (×4): 40 ug via INTRAVENOUS
  Administered 2015-07-19: 80 ug via INTRAVENOUS
  Administered 2015-07-19: 40 ug via INTRAVENOUS
  Administered 2015-07-19: 80 ug via INTRAVENOUS
  Administered 2015-07-19: 40 ug via INTRAVENOUS
  Administered 2015-07-19: 80 ug via INTRAVENOUS
  Administered 2015-07-19: 40 ug via INTRAVENOUS

## 2015-07-19 MED ORDER — CEFAZOLIN SODIUM-DEXTROSE 2-3 GM-% IV SOLR
2.0000 g | Freq: Once | INTRAVENOUS | Status: AC
  Administered 2015-07-19: 2 g via INTRAVENOUS

## 2015-07-19 MED ORDER — MAGNESIUM HYDROXIDE 400 MG/5ML PO SUSP: 30.0000 mL | Freq: Every day | ORAL | Status: DC | PRN

## 2015-07-19 MED ORDER — SODIUM CHLORIDE 0.9 % IR SOLN
Status: DC | PRN
  Administered 2015-07-19: 1000 mL

## 2015-07-19 MED ORDER — MIDAZOLAM HCL 2 MG/2ML IJ SOLN
INTRAMUSCULAR | Status: AC
  Filled 2015-07-19: qty 2

## 2015-07-19 MED ORDER — POTASSIUM CHLORIDE IN NACL 20-0.45 MEQ/L-% IV SOLN
INTRAVENOUS | Status: DC
  Filled 2015-07-19 (×7): qty 1000

## 2015-07-19 MED ORDER — DEXTROSE-NACL 5-0.9 % IV SOLN
INTRAVENOUS | Status: DC
  Administered 2015-07-19: 01:00:00 via INTRAVENOUS

## 2015-07-19 MED ORDER — FENTANYL CITRATE (PF) 100 MCG/2ML IJ SOLN: 25.0000 ug | INTRAMUSCULAR | Status: DC | PRN

## 2015-07-19 MED ORDER — ONDANSETRON HCL 4 MG PO TABS
4.0000 mg | ORAL_TABLET | Freq: Four times a day (QID) | ORAL | Status: DC | PRN
  Administered 2015-07-21: 4 mg via ORAL
  Filled 2015-07-19: qty 1

## 2015-07-19 MED ORDER — PHENYLEPHRINE 40 MCG/ML (10ML) SYRINGE FOR IV PUSH (FOR BLOOD PRESSURE SUPPORT)
PREFILLED_SYRINGE | INTRAVENOUS | Status: AC
  Filled 2015-07-19: qty 10

## 2015-07-19 MED ORDER — HEPARIN SODIUM (PORCINE) 5000 UNIT/ML IJ SOLN
5000.0000 [IU] | Freq: Three times a day (TID) | INTRAMUSCULAR | Status: DC
  Filled 2015-07-19: qty 1

## 2015-07-19 MED ORDER — LEVOTHYROXINE SODIUM 88 MCG PO TABS
88.0000 ug | ORAL_TABLET | Freq: Every day | ORAL | Status: DC
  Administered 2015-07-20 – 2015-07-21 (×2): 88 ug via ORAL
  Filled 2015-07-19 (×2): qty 1

## 2015-07-19 MED ORDER — ONDANSETRON HCL 4 MG/2ML IJ SOLN
4.0000 mg | Freq: Once | INTRAMUSCULAR | Status: AC | PRN
  Administered 2015-07-19: 4 mg via INTRAVENOUS

## 2015-07-19 MED ORDER — CEFAZOLIN SODIUM-DEXTROSE 2-3 GM-% IV SOLR
INTRAVENOUS | Status: AC
  Filled 2015-07-19: qty 50

## 2015-07-19 MED ORDER — FENTANYL CITRATE (PF) 100 MCG/2ML IJ SOLN
INTRAMUSCULAR | Status: DC | PRN
  Administered 2015-07-19: 12.5 ug via INTRATHECAL
  Administered 2015-07-19 (×2): 12.5 ug via INTRAVENOUS

## 2015-07-19 MED ORDER — BUPIVACAINE IN DEXTROSE 0.75-8.25 % IT SOLN
INTRATHECAL | Status: DC | PRN
  Administered 2015-07-19: 15 mg via INTRATHECAL

## 2015-07-19 MED ORDER — MORPHINE SULFATE (PF) 4 MG/ML IV SOLN
4.0000 mg | INTRAVENOUS | Status: DC | PRN
  Administered 2015-07-19: 4 mg via INTRAVENOUS
  Filled 2015-07-19 (×2): qty 1

## 2015-07-19 MED ORDER — LIDOCAINE HCL (PF) 1 % IJ SOLN
INTRAMUSCULAR | Status: AC
  Filled 2015-07-19: qty 5

## 2015-07-19 MED ORDER — SODIUM CHLORIDE 0.9 % IV SOLN
Freq: Once | INTRAVENOUS | Status: AC
  Administered 2015-07-19: 09:00:00 via INTRAVENOUS

## 2015-07-19 MED ORDER — ENOXAPARIN SODIUM 30 MG/0.3ML ~~LOC~~ SOLN
30.0000 mg | SUBCUTANEOUS | Status: DC
  Administered 2015-07-20 – 2015-07-21 (×2): 30 mg via SUBCUTANEOUS
  Filled 2015-07-19 (×2): qty 0.3

## 2015-07-19 MED ORDER — PROMETHAZINE HCL 25 MG/ML IJ SOLN: 12.5000 mg | INTRAMUSCULAR | Status: DC | PRN

## 2015-07-19 MED ORDER — ACETAMINOPHEN 650 MG RE SUPP: 650.0000 mg | Freq: Four times a day (QID) | RECTAL | Status: DC | PRN

## 2015-07-19 MED ORDER — BUPIVACAINE IN DEXTROSE 0.75-8.25 % IT SOLN
INTRATHECAL | Status: AC
  Filled 2015-07-19: qty 2

## 2015-07-19 MED ORDER — ONDANSETRON HCL 4 MG/2ML IJ SOLN
4.0000 mg | Freq: Once | INTRAMUSCULAR | Status: AC
  Administered 2015-07-19: 4 mg via INTRAVENOUS

## 2015-07-19 MED ORDER — ACETAMINOPHEN 325 MG PO TABS: 650.0000 mg | ORAL_TABLET | Freq: Four times a day (QID) | ORAL | Status: DC | PRN

## 2015-07-19 MED ORDER — MIDAZOLAM HCL 5 MG/5ML IJ SOLN
INTRAMUSCULAR | Status: DC | PRN
  Administered 2015-07-19: 0.5 mg via INTRAVENOUS

## 2015-07-19 MED ORDER — MIDAZOLAM HCL 2 MG/2ML IJ SOLN
1.0000 mg | INTRAMUSCULAR | Status: DC | PRN
  Administered 2015-07-19: 2 mg via INTRAVENOUS

## 2015-07-19 MED ORDER — PROPOFOL INFUSION 10 MG/ML OPTIME
INTRAVENOUS | Status: DC | PRN
  Administered 2015-07-19: 25 ug/kg/min via INTRAVENOUS

## 2015-07-19 MED ORDER — MIDAZOLAM HCL 2 MG/2ML IJ SOLN
INTRAMUSCULAR | Status: AC
  Filled 2015-07-19: qty 4

## 2015-07-19 MED ORDER — DEXTROSE-NACL 5-0.45 % IV SOLN
INTRAVENOUS | Status: DC
  Administered 2015-07-19: 15:00:00 via INTRAVENOUS

## 2015-07-19 MED ORDER — ALBUTEROL SULFATE (2.5 MG/3ML) 0.083% IN NEBU
2.5000 mg | INHALATION_SOLUTION | Freq: Four times a day (QID) | RESPIRATORY_TRACT | Status: DC
  Administered 2015-07-19: 2.5 mg via RESPIRATORY_TRACT
  Filled 2015-07-19: qty 3

## 2015-07-19 MED ORDER — ALBUTEROL SULFATE (2.5 MG/3ML) 0.083% IN NEBU: 2.5000 mg | INHALATION_SOLUTION | Freq: Four times a day (QID) | RESPIRATORY_TRACT | Status: DC | PRN

## 2015-07-19 MED ORDER — SODIUM CHLORIDE 0.9 % IV SOLN
INTRAVENOUS | Status: DC | PRN
  Administered 2015-07-19: 11:00:00 via INTRAVENOUS

## 2015-07-19 MED ORDER — HYDROCODONE-ACETAMINOPHEN 5-325 MG PO TABS
1.0000 | ORAL_TABLET | ORAL | Status: AC | PRN
  Administered 2015-07-19: 1 via ORAL
  Filled 2015-07-19: qty 1

## 2015-07-19 MED ORDER — FENTANYL CITRATE (PF) 100 MCG/2ML IJ SOLN
INTRAMUSCULAR | Status: AC
  Filled 2015-07-19: qty 4

## 2015-07-19 SURGICAL SUPPLY — 54 items
BAG HAMPER (MISCELLANEOUS) ×3 IMPLANT
BIT DRILL TWIST 3.5MM (BIT) ×1 IMPLANT
BLADE 10 SAFETY STRL DISP (BLADE) ×6 IMPLANT
BLADE SURG SZ20 CARB STEEL (BLADE) ×3 IMPLANT
CHLORAPREP W/TINT 26ML (MISCELLANEOUS) ×3 IMPLANT
CLOTH BEACON ORANGE TIMEOUT ST (SAFETY) ×3 IMPLANT
COVER LIGHT HANDLE STERIS (MISCELLANEOUS) ×6 IMPLANT
COVER MAYO STAND XLG (DRAPE) ×3 IMPLANT
DRAPE STERI IOBAN 125X83 (DRAPES) ×3 IMPLANT
DRILL TWIST 3.5MM (BIT) ×3
ELECT REM PT RETURN 9FT ADLT (ELECTROSURGICAL) ×3
ELECTRODE REM PT RTRN 9FT ADLT (ELECTROSURGICAL) ×1 IMPLANT
EVACUATOR 3/16  PVC DRAIN (DRAIN) ×2
EVACUATOR 3/16 PVC DRAIN (DRAIN) ×1 IMPLANT
GAUZE SPONGE 4X4 12PLY STRL (GAUZE/BANDAGES/DRESSINGS) ×2 IMPLANT
GAUZE XEROFORM 5X9 LF (GAUZE/BANDAGES/DRESSINGS) ×3 IMPLANT
GLOVE BIO SURGEON STRL SZ8 (GLOVE) ×3 IMPLANT
GLOVE BIO SURGEON STRL SZ8.5 (GLOVE) ×3 IMPLANT
GLOVE BIOGEL M 7.0 STRL (GLOVE) ×4 IMPLANT
GLOVE BIOGEL PI IND STRL 7.0 (GLOVE) IMPLANT
GLOVE BIOGEL PI INDICATOR 7.0 (GLOVE) ×4
GLOVE EXAM NITRILE MD LF STRL (GLOVE) ×2 IMPLANT
GOWN STRL REUS W/TWL LRG LVL3 (GOWN DISPOSABLE) ×6 IMPLANT
GOWN STRL REUS W/TWL XL LVL3 (GOWN DISPOSABLE) ×3 IMPLANT
GUIDE PIN CALIBRATED (PIN) ×3 IMPLANT
GUIDE PIN CALIBRATED 2.4X23 (PIN) ×1 IMPLANT
INST SET MAJOR BONE (KITS) ×3 IMPLANT
KIT BLADEGUARD II DBL (SET/KITS/TRAYS/PACK) ×3 IMPLANT
KIT ROOM TURNOVER AP CYSTO (KITS) ×3 IMPLANT
MANIFOLD NEPTUNE II (INSTRUMENTS) ×3 IMPLANT
MARKER SKIN DUAL TIP RULER LAB (MISCELLANEOUS) ×3 IMPLANT
NS IRRIG 1000ML POUR BTL (IV SOLUTION) ×3 IMPLANT
PACK BASIC III (CUSTOM PROCEDURE TRAY) ×3
PACK SRG BSC III STRL LF ECLPS (CUSTOM PROCEDURE TRAY) ×1 IMPLANT
PAD ABD 5X9 TENDERSORB (GAUZE/BANDAGES/DRESSINGS) ×3 IMPLANT
PAD ARMBOARD 7.5X6 YLW CONV (MISCELLANEOUS) ×3 IMPLANT
PENCIL HANDSWITCHING (ELECTRODE) ×3 IMPLANT
PLATE SHORT BARREL 135X4 (Plate) ×2 IMPLANT
SCREW CORTICAL SFTP 4.5X32MM (Screw) ×4 IMPLANT
SCREW CORTICAL SFTP 4.5X34MM (Screw) ×2 IMPLANT
SCREW CORTICAL SFTP 4.5X40MM (Screw) ×2 IMPLANT
SCREW LAG 85MM (Screw) ×3 IMPLANT
SCREW LAGSTD 85X21X12.7X9 (Screw) IMPLANT
SET BASIN LINEN APH (SET/KITS/TRAYS/PACK) ×3 IMPLANT
SPONGE GAUZE 4X4 12PLY (GAUZE/BANDAGES/DRESSINGS) ×2 IMPLANT
SPONGE LAP 18X18 X RAY DECT (DISPOSABLE) ×6 IMPLANT
STAPLER VISISTAT 35W (STAPLE) ×3 IMPLANT
SUT BRALON NAB BRD #1 30IN (SUTURE) ×6 IMPLANT
SUT PLAIN 2 0 XLH (SUTURE) ×3 IMPLANT
SUT SILK 0 FSL (SUTURE) ×3 IMPLANT
SYR BULB IRRIGATION 50ML (SYRINGE) ×3 IMPLANT
TAPE MEDIFIX FOAM 3 (GAUZE/BANDAGES/DRESSINGS) ×3 IMPLANT
YANKAUER SUCT 12FT TUBE ARGYLE (SUCTIONS) ×3 IMPLANT
YANKAUER SUCT BULB TIP NO VENT (SUCTIONS) ×2 IMPLANT

## 2015-07-19 NOTE — Clinical Documentation Improvement (Signed)
  Internal Medicine  Can the diagnosis of acute renal failure be further specified?   Acute Renal Failure/Acute Kidney Injury  Acute Tubular Necrosis  Acute Renal Cortical Necrosis  Acute Renal Medullary Necrosis  Acute on Chronic Renal Failure  Chronic Renal Failure  Other  Clinically Undetermined  Document any associated diagnoses/conditions.   Supporting Information: Creatinine slightly higher than several years ago at 1.5.  GFR 28.   Please exercise your independent, professional judgment when responding. A specific answer is not anticipated or expected.   Thank You,  [place]

## 2015-07-19 NOTE — Clinical Documentation Improvement (Signed)
Internal Medicine  Can the diagnosis of CKD be further specified?   CKD Stage I - GFR greater than or equal to 90  CKD Stage II - GFR 60-89  CKD Stage III - GFR 30-59  CKD Stage IV - GFR 15-29  CKD Stage V - GFR < 15  ESRD (End Stage Renal Disease)  Other condition  Unable to clinically determine   Supporting Information: : (risk factors, signs and symptoms, diagnostics, treatment) GFR 28. Documentation that current Creatinine is 1.5 and slightly higher of 1.33 several years ago.  Please exercise your independent, professional judgment when responding. A specific answer is not anticipated or expected.   Thank You, [place]

## 2015-07-19 NOTE — Care Management Note (Signed)
Case Management Note  Patient Details  Name: Amanda Prince MRN: [id number] Date of Birth: 10/02/1917  Expected Discharge Date:  07/21/15               Expected Discharge Plan:  [place]  In-House Referral:  Clinical Social Work  Discharge planning Services  CM Consult  Post Acute Care Choice:  NA Choice offered to:  NA  DME Arranged:    DME Agency:     HH Arranged:    [place] Agency:     Status of Service:  In process, will continue to follow  Medicare Important Message Given:    Date Medicare IM Given:    Medicare IM give by:    Date Additional Medicare IM Given:    Additional Medicare Important Message give by:     If discussed at [place] of Stay Meetings, dates discussed:    Additional Comments: Pt is from home, lives alone and is independent at baseline. Pt will have surgical repair of hip fx today. Plan for pt to DC to SNF for rehab. CSW is aware of DC plan. Will cont to follow for DC planning after PT evaluates pt following surgery.  Sherald Barge, RN 07/19/2015, 9:15 AM

## 2015-07-19 NOTE — Anesthesia Procedure Notes (Addendum)
Procedure Name: MAC Date/Time: 07/19/2015 11:02 AM Performed by: Vista Deck Pre-anesthesia Checklist: Patient identified, Emergency Drugs available, Suction available, Timeout performed and Patient being monitored Patient Re-evaluated:Patient Re-evaluated prior to inductionOxygen Delivery Method: Non-rebreather mask    Spinal Patient location during procedure: OR Start time: 07/19/2015 11:17 AM End time: 07/19/2015 11:21 AM Staffing Resident/CRNA: Vista Deck Preanesthetic Checklist Completed: patient identified, site marked, surgical consent, pre-op evaluation, timeout performed, IV checked, risks and benefits discussed and monitors and equipment checked Spinal Block Patient position: right lateral decubitus Prep: Betadine Patient monitoring: heart rate, cardiac monitor, continuous pulse ox and blood pressure Approach: right paramedian Location: L3-4 Injection technique: single-shot Needle Needle type: Spinocan  Needle gauge: 22 G Needle length: 9 cm Assessment Sensory level: T6 Additional Notes ATTEMPTS: 1 TRAY ID: [id number] TRAY EXPIRATION DATE: 2017-12  Procedure Name: MAC Date/Time: 07/19/2015 12:30 PM Performed by: Vista Deck Pre-anesthesia Checklist: Patient identified, Emergency Drugs available, Suction available, Timeout performed and Patient being monitored Patient Re-evaluated:Patient Re-evaluated prior to inductionOxygen Delivery Method: Nasal Cannula

## 2015-07-19 NOTE — Op Note (Signed)
NAME:  Amanda Prince, Amanda Prince                ACCOUNT NO.:  [id number]  MEDICAL RECORD NO.:  [id number]  LOCATION:  A313                          FACILITY:  [facility]  PHYSICIAN:  J. Sanjuana Kava, M.D. DATE OF BIRTH:  10/02/1917  DATE OF PROCEDURE: DATE OF DISCHARGE:                              OPERATIVE REPORT   PREOPERATIVE DIAGNOSIS:  Intertrochanteric fracture of the right hip.  POSTOPERATIVE DIAGNOSIS:  Intertrochanteric fracture of the right hip.  PROCEDURE:  Open treatment and internal reduction of the right intertrochanteric fracture of the hip using a Richards hip compression screw system with an 85-mm compression screw, 135-degree short barrel 4- hole side plate with screws measuring from 42 mm to 32 mm. drain.  DRAIN:  One large Hemovac drain.  ANESTHESIA:  Spinal.  SURGEON:  J. Sanjuana Kava, M.D.  ASSISTANT:  Simonne Maffucci, RN.  BLOOD LOSS:  About 125 cc.  None replaced.  INDICATIONS:  The patient fell last night sustaining above-mentioned injury.  She has been seen and evaluated by the hospitalist.  She felt to be a candidate for surgery, but an increased risk secondary to her age.  I went over the risks and imponderables with the family and the patient.  She asked appropriate questions and appeared to understand and agreed to the procedure as outlined.  DESCRIPTION OF PROCEDURE:  The patient was seen in the holding area and a mark was placed on the right hip.  She was brought back to the operating room, placed supine on the fracture table after spinal anesthesia had been obtained.  A mark on the right hip was still present.  She was positioned on the table, and the C-arm fluoroscopy unit was brought in.  Everyone had lead aprons, badges, and the thyroid shields.  C-arm fluoroscopy unit showed the fracture was reduced after we had her on the table and reduced it.  The patient was then prepped and draped in usual manner.  Again, the mark for the right hip was  still visible.  We had a time-out identifying the patient as Amanda Prince and we are doing right hip for intertrochanteric fracture of the hip.  All instrumentation properly working.  OR team knew each other.  Incision was made through skin and subcutaneous tissue, tensor fascia lata, vastus lateralis.  Femoral shaft was identified, guide pin was placed with good AP and lateral views.  It measured approximately 90 mm and 135-degree angle was selected, 85 mm long compression screw, and this was inserted after using a step drill.  A short barrel 4-hole 135- degree side plate was attached and then drill holes were made under the compression system.  First one was a 42 mm and then went down two 34 mm and two 32 mm screws.  Permanent pictures were taken.  Hemovac drain was placed.  The vastus lateralis was reapproximated using running locking suture of #1 Bralon.  The fascial layer of tensor fascia lata was reapproximated using #1 Surgilon suture with interrupted figure-of-eight manner.  Subcutaneous tissue was closed in layers using 2-0 plain, and skin was reapproximated with skin staples.  Hemovac was sewn in with 0 silk.  Sterile dressing was applied.  The  patient tolerated the procedure well and went to recovery in good condition.          ______________________________ Lenna Sciara. Sanjuana Kava, M.D.     JWK/MEDQ  D:  07/19/2015  T:  07/19/2015  Job:  [id number]

## 2015-07-19 NOTE — Brief Op Note (Signed)
07/18/2015 - 07/19/2015  12:39 PM  PATIENT:  Amanda Prince  81 y.o. female  PRE-OPERATIVE DIAGNOSIS:  Intertrochanteric Fracture Right Hip  POST-OPERATIVE DIAGNOSIS:  Intertrochanteric Fracture Right Hip  PROCEDURE:  Procedure(s): OPEN REDUCTION INTERNAL FIXATION HIP (Right)  SURGEON:  Surgeon(s) and Role:    * Sanjuana Kava, MD - Primary  PHYSICIAN ASSISTANT:   ASSISTANTS: Simonne Maffucci, RN   ANESTHESIA:   spinal  EBL:  Total I/O In: 9449 [I.V.:1125] Out: 125 [Blood:125]  BLOOD ADMINISTERED:none  DRAINS: Large Hemovac Drain in the right hip area  LOCAL MEDICATIONS USED:  NONE  SPECIMEN:  No Specimen  DISPOSITION OF SPECIMEN:  PATHOLOGY  COUNTS:  YES  TOURNIQUET:  * No tourniquets in log *  DICTATION: .Other Dictation: Dictation Number [id number]  PLAN OF CARE: Admit to inpatient   PATIENT DISPOSITION:  PACU - hemodynamically stable.   Delay start of Pharmacological VTE agent (>24hrs) due to surgical blood loss or risk of bleeding: not applicable

## 2015-07-19 NOTE — Anesthesia Postprocedure Evaluation (Signed)
  Anesthesia Post-op Note  Patient: Amanda Prince  Procedure(s) Performed: Procedure(s): OPEN REDUCTION INTERNAL FIXATION HIP (Right)  Patient Location: PACU  Anesthesia Type:Spinal  Level of Consciousness: awake, alert  and patient cooperative  Airway and Oxygen Therapy: Patient Spontanous Breathing  Post-op Pain: none  Post-op Assessment: Post-op Vital signs reviewed, Patient's Cardiovascular Status Stable, Respiratory Function Stable and Patent Airway              Post-op Vital Signs: Reviewed and stable  Last Vitals:  Filed Vitals:   07/19/15 1300  BP: 100/86  Pulse: 73  Temp:   Resp: 18    Complications: No apparent anesthesia complications

## 2015-07-19 NOTE — Anesthesia Preprocedure Evaluation (Addendum)
Anesthesia Evaluation  Patient identified by MRN, date of birth, ID band Patient awake    Reviewed: Allergy & Precautions, NPO status , Patient's Chart, lab work & pertinent test results  Airway Mallampati: III  TM Distance: <3 FB     Dental  (+) Missing, Poor Dentition, Dental Advisory Given   Pulmonary neg pulmonary ROS,  breath sounds clear to auscultation        Cardiovascular hypertension, Pt. on medications Rhythm:Regular Rate:Normal     Neuro/Psych PSYCHIATRIC DISORDERS Anxiety    GI/Hepatic negative GI ROS,   Endo/Other  Hypothyroidism   Renal/GU Renal InsufficiencyRenal disease     Musculoskeletal   Abdominal   Peds  Hematology   Anesthesia Other Findings Denies syncope or LOC.  Reproductive/Obstetrics                            Anesthesia Physical Anesthesia Plan  ASA: III  Anesthesia Plan: Spinal   Post-op Pain Management:    Induction:   Airway Management Planned: Simple Face Mask  Additional Equipment:   Intra-op Plan:   Post-operative Plan:   Informed Consent: I have reviewed the patients History and Physical, chart, labs and discussed the procedure including the risks, benefits and alternatives for the proposed anesthesia with the patient or authorized representative who has indicated his/her understanding and acceptance.     Plan Discussed with:   Anesthesia Plan Comments: (PRBC transfusion in progress from floor.)        Anesthesia Quick Evaluation

## 2015-07-19 NOTE — Progress Notes (Signed)
Blood completed.

## 2015-07-19 NOTE — Consult Note (Signed)
Reason for Consult:Fracture of the right hip Referring Physician: Hospitalist  Amanda Prince is an 81 y.o. female.  HPI: She fell at home last night and hurt her right hip.  She was unable to stand.  X-rays show an intertrochanteric fracture of the right hip with some displacement.  She has no other injury.  She has been seen and evaluated by the hospitalist.  She is felt to be a surgical candidate with increased risk mainly because of her age.  She is amazingly healthy.  I have talked to the patient and her family members present.  I have gone over the need for surgery and the risks and imponderables of the procedure.  These include:  Infection, embolus which could cause death, need for blood transfusion (she has an unit ordered for this morning), need for physical therapy and a walker, need for post hospital skilled nursing home, and anesthesia risks among others.  I have suggested consideration of spinal anesthesia.  They all appeared to understand and asked appropriate questions.  I plan to do surgery today, surgical schedule permitting.  Past Medical History  Diagnosis Date  . HTN (hypertension)   . Thyroid disease   . Seasonal allergies   . Vertigo   . Anxiety   . Kidney stone   . Arthritis     Past Surgical History  Procedure Laterality Date  . Kidney stones    . Gallbladder surgery    . Otif right wrist  Dr Aline Brochure 2012  . Wrist fracture surgery      Family History  Problem Relation Age of Onset  . Heart disease      Social History:  reports that she has never smoked. She does not have any smokeless tobacco history on file. She reports that she does not drink alcohol or use illicit drugs.  Allergies:  Allergies  Allergen Reactions  . Sulfa Antibiotics     Medications: I have reviewed the patient's current medications.  Results for orders placed or performed during the hospital encounter of 07/18/15 (from the past 48 hour(s))  CBC with Differential     Status:  Abnormal   Collection Time: 07/18/15 10:20 PM  Result Value Ref Range   WBC 8.3 4.0 - 10.5 K/uL   RBC 3.06 (L) 3.87 - 5.11 MIL/uL   Hemoglobin 9.9 (L) 12.0 - 15.0 g/dL   HCT 30.3 (L) 36.0 - 46.0 %   MCV 99.0 78.0 - 100.0 fL   MCH 32.4 26.0 - 34.0 pg   MCHC 32.7 30.0 - 36.0 g/dL   RDW 12.4 11.5 - 15.5 %   Platelets 257 150 - 400 K/uL   Neutrophils Relative % 76 43 - 77 %   Neutro Abs 6.4 1.7 - 7.7 K/uL   Lymphocytes Relative 16 12 - 46 %   Lymphs Abs 1.3 0.7 - 4.0 K/uL   Monocytes Relative 6 3 - 12 %   Monocytes Absolute 0.5 0.1 - 1.0 K/uL   Eosinophils Relative 2 0 - 5 %   Eosinophils Absolute 0.1 0.0 - 0.7 K/uL   Basophils Relative 0 0 - 1 %   Basophils Absolute 0.0 0.0 - 0.1 K/uL  Basic metabolic panel     Status: Abnormal   Collection Time: 07/18/15 10:20 PM  Result Value Ref Range   Sodium 139 135 - 145 mmol/L   Potassium 4.4 3.5 - 5.1 mmol/L   Chloride 105 101 - 111 mmol/L   CO2 25 22 - 32 mmol/L  Glucose, Bld 117 (H) 65 - 99 mg/dL   BUN 28 (H) 6 - 20 mg/dL   Creatinine, Ser 1.54 (H) 0.44 - 1.00 mg/dL   Calcium 9.2 8.9 - 10.3 mg/dL   GFR calc non Af Amer 28 (L) >60 mL/min   GFR calc Af Amer 32 (L) >60 mL/min    Comment: (NOTE) The eGFR has been calculated using the CKD EPI equation. This calculation has not been validated in all clinical situations. eGFR's persistently <60 mL/min signify possible Chronic Kidney Disease.    Anion gap 9 5 - 15  Type and screen     Status: None   Collection Time: 07/18/15 10:20 PM  Result Value Ref Range   ABO/RH(D) O POS    Antibody Screen NEG    Sample Expiration 07/21/2015   TSH     Status: None   Collection Time: 07/18/15 10:20 PM  Result Value Ref Range   TSH 0.861 0.350 - 4.500 uIU/mL  Urinalysis, Routine w reflex microscopic (not at [facility])     Status: None   Collection Time: 07/18/15 10:32 PM  Result Value Ref Range   Color, Urine YELLOW YELLOW   APPearance CLEAR CLEAR   Specific Gravity, Urine 1.015 1.005 - 1.030    pH 5.5 5.0 - 8.0   Glucose, UA NEGATIVE NEGATIVE mg/dL   Hgb urine dipstick NEGATIVE NEGATIVE   Bilirubin Urine NEGATIVE NEGATIVE   Ketones, ur NEGATIVE NEGATIVE mg/dL   Protein, ur NEGATIVE NEGATIVE mg/dL   Urobilinogen, UA 0.2 0.0 - 1.0 mg/dL   Nitrite NEGATIVE NEGATIVE   Leukocytes, UA NEGATIVE NEGATIVE    Comment: MICROSCOPIC NOT DONE ON URINES WITH NEGATIVE PROTEIN, BLOOD, LEUKOCYTES, NITRITE, OR GLUCOSE <1000 mg/dL.  Surgical pcr screen     Status: None   Collection Time: 07/19/15  1:24 AM  Result Value Ref Range   MRSA, PCR NEGATIVE NEGATIVE   Staphylococcus aureus NEGATIVE NEGATIVE    Comment:        The Xpert SA Assay (FDA approved for NASAL specimens in patients over 82 years of age), is one component of a comprehensive surveillance program.  Test performance has been validated by [facility] for patients greater than or equal to 70 year old. It is not intended to diagnose infection nor to guide or monitor treatment.     Dg Chest 1 View  07/18/2015   CLINICAL DATA:  Trip and fall injury tonight. Preoperative examination.  EXAM: CHEST  1 VIEW  COMPARISON:  05/03/2011  FINDINGS: Shallow inspiration with elevation of the right hemidiaphragm. Heart size and pulmonary vascularity are normal. Diffuse interstitial pattern to the lungs may represent fibrosis or chronic bronchitic changes. No blunting of costophrenic angles. No pneumothorax. No focal consolidation. Calcification of the aorta.  IMPRESSION: Interstitial pattern to the lungs, likely chronic. No evidence of active pulmonary disease.   Electronically Signed   By: Lucienne Capers M.D.   On: 07/18/2015 23:06   Dg Hip Unilat With Pelvis 2-3 Views Right  07/18/2015   CLINICAL DATA:  81 year old female post trip and fall, now with severe right hip pain.  EXAM: DG HIP (WITH OR WITHOUT PELVIS) 2-3V RIGHT  COMPARISON:  None.  FINDINGS: Mildly comminuted intertrochanteric right hip fracture with mild displacement involving the  greater and lesser trochanters. Mild shortening and angulation. Femoral head remains seated in the acetabulum. Remainder the bony pelvis is intact.  IMPRESSION: Displaced mildly comminuted intertrochanteric right hip fracture.   Electronically Signed   By: Threasa Beards  Ehinger M.D.   On: 07/18/2015 23:06    Review of Systems  Cardiovascular:       History of hypertension  Musculoskeletal: Positive for falls (Fell last night at home and hurt the right hip).  Neurological:       History of some dizziness at times  Endo/Heme/Allergies:       History of hypothroidism   Blood pressure 127/45, pulse 96, temperature 99 F (37.2 C), temperature source Oral, resp. rate 26, height 5' 5"  (1.651 m), weight 52.708 kg (116 lb 3.2 oz), SpO2 94 %. Physical Exam  Constitutional: She appears well-developed and well-nourished.  HENT:  Head: Normocephalic and atraumatic.  Eyes: Conjunctivae and EOM are normal. Pupils are equal, round, and reactive to light.  Neck: Normal range of motion. Neck supple.  Cardiovascular: Normal rate, regular rhythm and intact distal pulses.   Respiratory: Effort normal.  GI: Soft.  Musculoskeletal: She exhibits tenderness (Pain to any motion of the right hip, externally rotated, not shortened very much, NV intact.).  Skin: Skin is warm and dry.  Psychiatric: She has a normal mood and affect. Her behavior is normal. Thought content normal.  She knows she broke her hip and is in the hospital.  She knows her relatives but is slightly confused most likely from the pain medicine.      Assessment/Plan: Intertrochanteric fracture of the right hip, slight displacement.  For surgical fixation today. Anemia, to receive a unit of blood today prior to surgery.  I have told the family she may need another transfusion after surgery. History of hypertension History of dizziness (she has a walker at home and knows how to use it) History of hypothyroidism    Zykira Matlack 07/19/2015, 8:02  AM

## 2015-07-19 NOTE — Transfer of Care (Addendum)
Immediate Anesthesia Transfer of Care Note  Patient: Amanda Prince  Procedure(s) Performed: Procedure(s): OPEN REDUCTION INTERNAL FIXATION HIP (Right)  Patient Location: PACU  Anesthesia Type:Spinal  Level of Consciousness: awake, alert  and patient cooperative  Airway & Oxygen Therapy: Patient Spontanous Breathing and Patient connected to nasal cannula oxygen  Post-op Assessment: stable T 12  Post vital signs: Reviewed and stable  Last Vitals:  Filed Vitals:   07/19/15 1055  BP: 156/78  Pulse:   Temp:   Resp: 16    Complications: No apparent anesthesia complications

## 2015-07-20 ENCOUNTER — Encounter (HOSPITAL_COMMUNITY): Payer: Self-pay | Admitting: Orthopaedic Surgery

## 2015-07-20 DIAGNOSIS — E038 Other specified hypothyroidism: Secondary | ICD-10-CM

## 2015-07-20 DIAGNOSIS — S72001A Fracture of unspecified part of neck of right femur, initial encounter for closed fracture: Secondary | ICD-10-CM

## 2015-07-20 LAB — BASIC METABOLIC PANEL
Anion gap: 5 (ref 5–15)
BUN: 19 mg/dL (ref 6–20)
CALCIUM: 8.4 mg/dL — AB (ref 8.9–10.3)
CO2: 25 mmol/L (ref 22–32)
CREATININE: 1.31 mg/dL — AB (ref 0.44–1.00)
Chloride: 108 mmol/L (ref 101–111)
GFR calc Af Amer: 39 mL/min — ABNORMAL LOW (ref >60)
GFR, EST NON AFRICAN AMERICAN: 33 mL/min — AB (ref >60)
GLUCOSE: 128 mg/dL — AB (ref 65–99)
Potassium: 3.8 mmol/L (ref 3.5–5.1)
Sodium: 138 mmol/L (ref 135–145)

## 2015-07-20 LAB — CBC WITH DIFFERENTIAL/PLATELET
Basophils Absolute: 0 10*3/uL (ref 0.0–0.1)
Basophils Relative: 0 % (ref 0–1)
EOS PCT: 0 % (ref 0–5)
Eosinophils Absolute: 0 10*3/uL (ref 0.0–0.7)
HCT: 27.7 % — ABNORMAL LOW (ref 36.0–46.0)
Hemoglobin: 9.4 g/dL — ABNORMAL LOW (ref 12.0–15.0)
LYMPHS ABS: 1.6 10*3/uL (ref 0.7–4.0)
LYMPHS PCT: 18 % (ref 12–46)
MCH: 32.2 pg (ref 26.0–34.0)
MCHC: 33.9 g/dL (ref 30.0–36.0)
MCV: 94.9 fL (ref 78.0–100.0)
MONO ABS: 0.8 10*3/uL (ref 0.1–1.0)
Monocytes Relative: 9 % (ref 3–12)
Neutro Abs: 6.5 10*3/uL (ref 1.7–7.7)
Neutrophils Relative %: 73 % (ref 43–77)
PLATELETS: 176 10*3/uL (ref 150–400)
RBC: 2.92 MIL/uL — ABNORMAL LOW (ref 3.87–5.11)
RDW: 14 % (ref 11.5–15.5)
WBC: 8.9 10*3/uL (ref 4.0–10.5)

## 2015-07-20 MED ORDER — SODIUM CHLORIDE 0.9 % IV SOLN
Freq: Once | INTRAVENOUS | Status: AC
  Administered 2015-07-20: 13:00:00 via INTRAVENOUS

## 2015-07-20 NOTE — Anesthesia Postprocedure Evaluation (Signed)
  Anesthesia Post-op Note  Patient: Amanda Prince  Procedure(s) Performed: Procedure(s): OPEN REDUCTION INTERNAL FIXATION HIP (Right)  Patient Location: room 313  Anesthesia Type:Spinal  Level of Consciousness: awake, alert , oriented and patient cooperative  Airway and Oxygen Therapy: Patient Spontanous Breathing and Patient connected to nasal cannula oxygen  Post-op Pain: 2 /10, mild  Post-op Assessment: Post-op Vital signs reviewed, Patient's Cardiovascular Status Stable, Respiratory Function Stable, Patent Airway, No signs of Nausea or vomiting, Adequate PO intake and Pain level controlled              Post-op Vital Signs: Reviewed and stable  Last Vitals:  Filed Vitals:   07/20/15 1317  BP:   Pulse:   Temp: 38.2 C  Resp:     Complications: No apparent anesthesia complications

## 2015-07-20 NOTE — Clinical Social Work Placement (Signed)
   CLINICAL SOCIAL WORK PLACEMENT  NOTE  Date:  07/20/2015  Patient Details  Name: Amanda Prince MRN: [id number] Date of Birth: 10/02/1917  Clinical Social Work is seeking post-discharge placement for this patient at the [place] level of care (*CSW will initial, date and re-position this form in  chart as items are completed):  Yes   Patient/family provided with [place] Work Department's list of facilities offering this level of care within the geographic area requested by the patient (or if unable, by the patient's family).  Yes   Patient/family informed of their freedom to choose among providers that offer the needed level of care, that participate in Medicare, Medicaid or managed care program needed by the patient, have an available bed and are willing to accept the patient.  Yes   Patient/family informed of [place]'s ownership interest in [facility] and [facility], as well as of the fact that they are under no obligation to receive care at these facilities.  PASRR submitted to EDS on 07/19/15     PASRR number received on 07/19/15     Existing PASRR number confirmed on       FL2 transmitted to all facilities in geographic area requested by pt/family on 07/20/15     FL2 transmitted to all facilities within larger geographic area on       Patient informed that his/her managed care company has contracts with or will negotiate with certain facilities, including the following:            Patient/family informed of bed offers received.  Patient chooses bed at       Physician recommends and patient chooses bed at      Patient to be transferred to   on  .  Patient to be transferred to facility by       Patient family notified on   of transfer.  Name of family member notified:        PHYSICIAN       Additional Comment:    _______________________________________________ Salome Arnt, LCSW 07/20/2015, 11:38  AM [phone]

## 2015-07-20 NOTE — Progress Notes (Addendum)
[facility] PROGRESS NOTE  Amanda Prince ZOX:[id number] DOB: 10/02/1917 DOA: 07/18/2015 PCP: Clinton Quant, MD  Assessment/Plan: Right Hip Fracture -S/p repair by Dr. Luna Glasgow on 8/24. -On lovenox for DVT prophylaxis. -Will need ST-SNF for rehab purposes. -Is c/o some right foot pain, which she states she injured during her fall. Has some edema and pain of that ankle. Will order xray to make sure no fracture. If negative, probably has a mild sprain.  Anemia -Hb 9.5, 9.9 8/24. -Ortho has ordered transfusion today due to blood losses during surgery.  Hypothyroidism -Continue synthroid.  HTN -Well controlled. -Not currently on any meds.  Acute on CKD Stage III -Baseline Cr 1.3. -Due to prerenal azotemia. -Cr back to baseline today after some IVF given overnight.   Code Status: Full code Family Communication: Neighbor and friend Patsy at bedside  Disposition Plan: ST-SNF in am   Consultants:  Ortho, Dr. Luna Glasgow   Antibiotics:  None   Subjective: Feels well, minimal pain. Does c/o right foot pain which she states she also injured during the fall.  Objective: Filed Vitals:   07/20/15 0004 07/20/15 0454 07/20/15 0956 07/20/15 1039  BP: 121/48 146/76 108/41 114/47  Pulse: 101 103 99 102  Temp: 98.9 F (37.2 C) 99.6 F (37.6 C) 98.9 F (37.2 C) 99.9 F (37.7 C)  TempSrc: Oral Oral Oral Oral  Resp: 20 20 20 20   Height:      Weight:      SpO2: 97% 92% 94% 97%    Intake/Output Summary (Last 24 hours) at 07/20/15 1145 Last data filed at 07/20/15 0925  Gross per 24 hour  Intake   1835 ml  Output   1160 ml  Net    675 ml   Filed Weights   07/18/15 2122 07/19/15 0100 07/19/15 0955  Weight: 52.164 kg (115 lb) 52.708 kg (116 lb 3.2 oz) 52.617 kg (116 lb)    Exam:   General:  AA Ox3  Cardiovascular: RRR  Respiratory: CTA B  Abdomen: S/NT/ND/+BS  Extremities: edema of right ankle and pain to touch.   Neurologic:  Non-focal  Data  Reviewed: Basic Metabolic Panel:  Recent Labs Lab 07/18/15 2220 07/20/15 0559  NA 139 138  K 4.4 3.8  CL 105 108  CO2 25 25  GLUCOSE 117* 128*  BUN 28* 19  CREATININE 1.54* 1.31*  CALCIUM 9.2 8.4*   Liver Function Tests: No results for input(s): AST, ALT, ALKPHOS, BILITOT, PROT, ALBUMIN in the last 168 hours. No results for input(s): LIPASE, AMYLASE in the last 168 hours. No results for input(s): AMMONIA in the last 168 hours. CBC:  Recent Labs Lab 07/18/15 2220 07/20/15 0559  WBC 8.3 8.9  NEUTROABS 6.4 6.5  HGB 9.9* 9.4*  HCT 30.3* 27.7*  MCV 99.0 94.9  PLT 257 176   Cardiac Enzymes: No results for input(s): CKTOTAL, CKMB, CKMBINDEX, TROPONINI in the last 168 hours. BNP (last 3 results) No results for input(s): BNP in the last 8760 hours.  ProBNP (last 3 results) No results for input(s): PROBNP in the last 8760 hours.  CBG: No results for input(s): GLUCAP in the last 168 hours.  Recent Results (from the past 240 hour(s))  Surgical pcr screen     Status: None   Collection Time: 07/19/15  1:24 AM  Result Value Ref Range Status   MRSA, PCR NEGATIVE NEGATIVE Final   Staphylococcus aureus NEGATIVE NEGATIVE Final    Comment:        The  Xpert SA Assay (FDA approved for NASAL specimens in patients over 45 years of age), is one component of a comprehensive surveillance program.  Test performance has been validated by [facility] for patients greater than or equal to 44 year old. It is not intended to diagnose infection nor to guide or monitor treatment.      Studies: Dg Chest 1 View  07/18/2015   CLINICAL DATA:  Trip and fall injury tonight. Preoperative examination.  EXAM: CHEST  1 VIEW  COMPARISON:  05/03/2011  FINDINGS: Shallow inspiration with elevation of the right hemidiaphragm. Heart size and pulmonary vascularity are normal. Diffuse interstitial pattern to the lungs may represent fibrosis or chronic bronchitic changes. No blunting of costophrenic  angles. No pneumothorax. No focal consolidation. Calcification of the aorta.  IMPRESSION: Interstitial pattern to the lungs, likely chronic. No evidence of active pulmonary disease.   Electronically Signed   By: Lucienne Capers M.D.   On: 07/18/2015 23:06   Dg Hip Operative Unilat With Pelvis Right  07/19/2015   CLINICAL DATA:  Right hip fracture  EXAM: OPERATIVE right HIP (WITH PELVIS IF PERFORMED) 5 VIEWS  TECHNIQUE: Fluoroscopic spot image(s) were submitted for interpretation post-operatively.  COMPARISON:  None.  FINDINGS: Five spot films were obtained and reveal a fixation sideplate and multiple fixation screws as well as a compression screw traversing the femoral neck. Fracture fragments are in near anatomic alignment.  FLUOROSCOPY TIME:  27 seconds  IMPRESSION: ORIF of proximal right femoral fracture.   Electronically Signed   By: Inez Catalina M.D.   On: 07/19/2015 12:41   Dg Hip Unilat With Pelvis 2-3 Views Right  07/18/2015   CLINICAL DATA:  81 year old female post trip and fall, now with severe right hip pain.  EXAM: DG HIP (WITH OR WITHOUT PELVIS) 2-3V RIGHT  COMPARISON:  None.  FINDINGS: Mildly comminuted intertrochanteric right hip fracture with mild displacement involving the greater and lesser trochanters. Mild shortening and angulation. Femoral head remains seated in the acetabulum. Remainder the bony pelvis is intact.  IMPRESSION: Displaced mildly comminuted intertrochanteric right hip fracture.   Electronically Signed   By: Jeb Levering M.D.   On: 07/18/2015 23:06    Scheduled Meds: . sodium chloride   Intravenous Once  . enoxaparin (LOVENOX) injection  30 mg Subcutaneous Q24H  . levothyroxine  88 mcg Oral QAC breakfast   Continuous Infusions:   Principal Problem:   Hip fracture, right Active Problems:   Hypothyroidism   HTN (hypertension)   Hip fracture    Time spent: 25 minutes. Greater than 50% of this time was spent in direct contact with the patient coordinating  care.    Lelon Frohlich  [facility] Pager [phone]  If 7PM-7AM, please contact night-coverage at [email or web address], password [facility] 07/20/2015, 11:45 AM  LOS: 2 days

## 2015-07-20 NOTE — Clinical Social Work Placement (Signed)
   CLINICAL SOCIAL WORK PLACEMENT  NOTE  Date:  07/20/2015  Patient Details  Name: Amanda Prince MRN: [id number] Date of Birth: 10/02/1917  Clinical Social Work is seeking post-discharge placement for this patient at the [place] level of care (*CSW will initial, date and re-position this form in  chart as items are completed):  Yes   Patient/family provided with [place] Work Department's list of facilities offering this level of care within the geographic area requested by the patient (or if unable, by the patient's family).  Yes   Patient/family informed of their freedom to choose among providers that offer the needed level of care, that participate in Medicare, Medicaid or managed care program needed by the patient, have an available bed and are willing to accept the patient.  Yes   Patient/family informed of 's ownership interest in [facility] and [facility], as well as of the fact that they are under no obligation to receive care at these facilities.  PASRR submitted to EDS on 07/19/15     PASRR number received on 07/19/15     Existing PASRR number confirmed on       FL2 transmitted to all facilities in geographic area requested by pt/family on 07/20/15     FL2 transmitted to all facilities within larger geographic area on       Patient informed that his/her managed care company has contracts with or will negotiate with certain facilities, including the following:        Yes   Patient/family informed of bed offers received.  Patient chooses bed at [facility]     Physician recommends and patient chooses bed at      Patient to be transferred to   on  .  Patient to be transferred to facility by       Patient family notified on   of transfer.  Name of family member notified:        PHYSICIAN       Additional Comment:    _______________________________________________ Salome Arnt,  LCSW 07/20/2015, 2:04 PM [phone]

## 2015-07-20 NOTE — Progress Notes (Signed)
Physical Therapy Treatment Patient Details Name: Amanda Prince MRN: [id number] DOB: 10/02/1917 Today's Date: 07/20/2015    History of Present Illness Amanda Prince is an 81 y.o. female with hx of HTN, hypothyroidism, allergy, tripped over her feet and fell complaining of right hip pain. X ray showed right displaced comminuted intertrochanteric Fx. She has CKD and her current Cr is 1.5, slightly above her prior Cr of 1.33 several years ago. Her CXR showed no infiltrate and her UA was negative. Her Hb was 9.9 grams per dL. EKG showed NSR with no acute ST T changes. EDP spoke with Dr Aline Brochure or orthopedics, with plan to proceed with ORIF likely tomorrow, and hospitalist was asked to admit her for same.     PT Comments    Pt tolerated 2 hours of sitting in a recliner but is now fatigued.  She required max assist to do a stand pivot transfer back to bed.  She is currently receiving a blood transfusion.  She was too tired to work on any other exercise.  Will resume tomorrow.  Pain is well controlled and pt is very comfortable at this time.  Follow Up Recommendations  No PT follow up     Equipment Recommendations  Rolling walker with 5" wheels    Recommendations for Other Services  OT     Precautions / Restrictions Precautions Precautions: Fall Restrictions Weight Bearing Restrictions: Yes RLE Weight Bearing: Non weight bearing    Mobility  Bed Mobility Overal bed mobility: Needs Assistance Bed Mobility: Sit to Supine     Supine to sit: HOB elevated;Total assist Sit to supine: Total assist      Transfers Overall transfer level: Needs assistance Equipment used: Rolling walker (2 wheeled) Transfers: Sit to/from Stand Sit to Stand: Max assist Stand pivot transfers: Max assist       General transfer comment: pt able to stand to a walker and maintain stance for 1 minute but unable to offload any weight from the LLE in order to take a step...during pivot transfer to a  chair she was able to bear weight on the LLE  Ambulation/Gait Ambulation/Gait assistance:  (unable)               Stairs            Wheelchair Mobility    Modified Rankin (Stroke Patients Only)       Balance Overall balance assessment: Needs assistance Sitting-balance support: Bilateral upper extremity supported;Feet supported Sitting balance-Leahy Scale: Fair Sitting balance - Comments: avoids weight bearing on the right hip due to pain   Standing balance support: Bilateral upper extremity supported Standing balance-Leahy Scale: Fair                      Cognition Arousal/Alertness: Awake/alert Behavior During Therapy: WFL for tasks assessed/performed Overall Cognitive Status: Impaired/Different from baseline Area of Impairment: Orientation Orientation Level: Place;Time;Situation;Disoriented to             General Comments: pt is normally cognitively intact    Exercises General Exercises - Lower Extremity Ankle Circles/Pumps: Both;10 reps;Supine;AAROM Quad Sets: AROM;Both;10 reps;Supine Gluteal Sets: AROM;Both;10 reps;Supine Short Arc Quad: AROM;Left;10 reps;Supine Heel Slides: AAROM;Left;10 reps;Supine Hip ABduction/ADduction: AAROM;Left;10 reps;Supine    General Comments        Pertinent Vitals/Pain Pain Assessment: No/denies pain    Home Living Family/patient expects to be discharged to:: Skilled nursing facility  Prior Function Level of Independence: Independent with assistive device(s)      Comments: usually used a walker for gait   PT Goals (current goals can now be found in the care plan section) Acute Rehab PT Goals Patient Stated Goal: none stated PT Goal Formulation: With patient/family Time For Goal Achievement: 08/03/15 Potential to Achieve Goals: Good Progress towards PT goals: Progressing toward goals    Frequency  Min 6X/week    PT Plan Current plan remains appropriate     Co-evaluation             End of Session Equipment Utilized During Treatment: Gait belt Activity Tolerance: Patient tolerated treatment well;No increased pain Patient left: in bed;with call bell/phone within reach;with bed alarm set;with family/visitor present     Time: 1100-1119 PT Time Calculation (min) (ACUTE ONLY): 19 min  Charges:  $Therapeutic Exercise: 8-22 mins $Therapeutic Activity: 8-22 mins                    G CodesSable Feil  PT 07/20/2015, 11:33 AM [phone]

## 2015-07-20 NOTE — Progress Notes (Signed)
Subjective: 1 Day Post-Op Procedure(s) (LRB): OPEN REDUCTION INTERNAL FIXATION HIP (Right) Patient reports pain as moderate.    Objective: Vital signs in last 24 hours: Temp:  [97.4 F (36.3 C)-99.9 F (37.7 C)] 99.6 F (37.6 C) (08/25 0454) Pulse Rate:  [36-106] 103 (08/25 0454) Resp:  [16-30] 20 (08/25 0454) BP: (100-173)/(48-111) 146/76 mmHg (08/25 0454) SpO2:  [92 %-100 %] 92 % (08/25 0454) Weight:  [52.617 kg (116 lb)] 52.617 kg (116 lb) (08/24 0955)  Intake/Output from previous day: 08/24 0701 - 08/25 0700 In: 1355 [P.O.:30; I.V.:1325] Out: 1160 [Urine:975; Drains:60; Blood:125] Intake/Output this shift:     Recent Labs  07/18/15 2220 07/20/15 0559  HGB 9.9* 9.4*    Recent Labs  07/18/15 2220 07/20/15 0559  WBC 8.3 8.9  RBC 3.06* 2.92*  HCT 30.3* 27.7*  PLT 257 176    Recent Labs  07/18/15 2220 07/20/15 0559  NA 139 138  K 4.4 3.8  CL 105 108  CO2 25 25  BUN 28* 19  CREATININE 1.54* 1.31*  GLUCOSE 117* 128*  CALCIUM 9.2 8.4*   No results for input(s): LABPT, INR in the last 72 hours.  Neurologically intact Neurovascular intact Sensation intact distally Intact pulses distally Dorsiflexion/Plantar flexion intact   She got confused mid night last night.  I will transfuse one unit blood today because of anemia post surgical blood loss.  Physical therapy will begin today.  She is alert and oriented this am.  Family is present.  Her pain seems controlled well.  I will get urine culture also today.  Temp 99.9.  She has respiratory therapy already.    Assessment/Plan: 1 Day Post-Op Procedure(s) (LRB): OPEN REDUCTION INTERNAL FIXATION HIP (Right) Up with therapy  Amanda Prince 07/20/2015, 7:26 AM

## 2015-07-20 NOTE — Evaluation (Signed)
Physical Therapy Evaluation Patient Details Name: Amanda Prince MRN: [id number] DOB: 10/02/1917 Today's Date: 07/20/2015   History of Present Illness  Amanda Prince is an 81 y.o. female with hx of HTN, hypothyroidism, allergy, tripped over her feet and fell complaining of right hip pain. X ray showed right displaced comminuted intertrochanteric Fx. She has CKD and her current Cr is 1.5, slightly above her prior Cr of 1.33 several years ago. Her CXR showed no infiltrate and her UA was negative. Her Hb was 9.9 grams per dL. EKG showed NSR with no acute ST T changes. EDP spoke with Dr Amanda Prince or orthopedics, with plan to proceed with ORIF likely tomorrow, and hospitalist was asked to admit her for same.  Pt underwent OTIF on 07-19-15 to the right hip and is now seen by PT with TTWB orders on the RLE.  Clinical Impression   Pt was seen for evaluation/tx.  She was alert but oriented to self/daughters only.  This is not her norm per daughters report.  Pt was able to follow directions and was very cooperative.  She has a significant limitation of strength and ROM of the RLE due to recent fx/surgery.  She required total assist to sit at EOB but was able to stand with a walker for about 1 minute.  She required max assist to pivot transfer bed to chair.  Of note, the pt has limitation of  Dorsiflexion bilaterally (20 degrees) and daughters state that she never goes barefoot.  I have asked daughters to bring in her shoes (especially for the LLE) in order for her to easily achieve foot flat position.    Follow Up Recommendations SNF    Equipment Recommendations  None recommended by PT    Recommendations for Other Services   OT    Precautions / Restrictions Precautions Precautions: Fall Restrictions RLE Weight Bearing: Touchdown weight bearing      Mobility  Bed Mobility Overal bed mobility: Needs Assistance Bed Mobility: Supine to Sit     Supine to sit: HOB elevated;Total assist         Transfers Overall transfer level: Needs assistance Equipment used: Rolling walker (2 wheeled) Transfers: Sit to/from [facility] Sit to Stand: Max assist Stand pivot transfers: Max assist       General transfer comment: pt able to stand to a walker and maintain stance for 1 minute but unable to offload any weight from the LLE in order to take a step...during pivot transfer to a chair she was able to bear weight on the LLE  Ambulation/Gait Ambulation/Gait assistance:  (unable)                                    Balance Overall balance assessment: Needs assistance Sitting-balance support: Bilateral upper extremity supported;Feet supported Sitting balance-Leahy Scale: Fair Sitting balance - Comments: avoids weight bearing on the right hip due to pain   Standing balance support: Bilateral upper extremity supported Standing balance-Leahy Scale: Fair                               Pertinent Vitals/Pain Pain Assessment: No/denies pain (at rest)    Home Living Family/patient expects to be discharged to:: Skilled nursing facility                      Prior Function Level  of Independence: Independent with assistive device(s)         Comments: usually used a walker for gait             Extremity/Trunk Assessment   Upper Extremity Assessment: Overall WFL for tasks assessed           Lower Extremity Assessment: RLE deficits/detail (limited DF to -20 degrees) RLE Deficits / Details: limited hip and knee flexion due to pain...pt has limited DF to -20 degrees    Cervical / Trunk Assessment: Kyphotic  Communication   Communication: No difficulties  Cognition Arousal/Alertness: Awake/alert Behavior During Therapy: WFL for tasks assessed/performed Overall Cognitive Status: Impaired/Different from baseline Area of Impairment: Orientation Orientation Level: Place;Time;Situation;Disoriented to              General Comments: pt is normally cognitively intact          Exercises General Exercises - Lower Extremity Ankle Circles/Pumps: Both;10 reps;Supine;AAROM Quad Sets: AROM;Both;10 reps;Supine Gluteal Sets: AROM;Both;10 reps;Supine Short Arc Quad: AROM;Left;10 reps;Supine Heel Slides: AAROM;Left;10 reps;Supine Hip ABduction/ADduction: AAROM;Left;10 reps;Supine      Assessment/Plan    PT Assessment Patient needs continued PT services  PT Diagnosis Difficulty walking;Acute pain   PT Problem List Decreased strength;Decreased range of motion;Decreased activity tolerance;Decreased balance;Decreased mobility;Decreased cognition;Decreased knowledge of use of DME;Decreased knowledge of precautions;Pain  PT Treatment Interventions DME instruction;Functional mobility training;Therapeutic exercise;Patient/family education   PT Goals (Current goals can be found in the Care Plan section) Acute Rehab PT Goals Patient Stated Goal: none stated PT Goal Formulation: With patient/family Time For Goal Achievement: 08/03/15 Potential to Achieve Goals: Good    Frequency Min 6X/week   Barriers to discharge Decreased caregiver support lives alone                   End of Session Equipment Utilized During Treatment: Gait belt Activity Tolerance: Patient tolerated treatment well;No increased pain Patient left: in chair;with call bell/phone within reach;with chair alarm set;with family/visitor present Nurse Communication: Mobility status         Time: 2993-7169 PT Time Calculation (min) (ACUTE ONLY): 45 min   Charges:   PT Evaluation $Initial PT Evaluation Tier I: 1 Procedure PT Treatments $Therapeutic Exercise: 8-22 mins   PT G CodesSable Prince  PT 07/20/2015, 9:28 AM [phone]

## 2015-07-20 NOTE — Clinical Social Work Note (Signed)
Clinical Social Work Assessment  Patient Details  Name: Amanda Prince MRN: [id number] Date of Birth: 10/02/1917  Date of referral:  07/20/15               Reason for consult:  Facility Placement                Permission sought to share information with:  Family Supports Permission granted to share information::     Name::     Rosalene Billings  Agency::     Relationship::  daughters  Contact Information:     Housing/Transportation Living arrangements for the past 2 months:  Single Family Home Source of Information:  Patient, Adult Children Patient Interpreter Needed:  None Criminal Activity/Legal Involvement Pertinent to Current Situation/Hospitalization:  No - Comment as needed Significant Relationships:  Adult Children Lives with:  Self Do you feel safe going back to the place where you live?  No Need for family participation in patient care:  Yes (Comment)  Care giving concerns:  Pt lives alone.    Social Worker assessment / plan:  CSW met with pt and pt's daughters, [name] and [name] at bedside. Pt resting and reports that she did not sleep well last night. She requests that CSW just talk with her daughters. They indicate that pt has been living alone with family coming in and out throughout the day. Pt is fairly independent at baseline and sometimes uses a walker. Pt has 5 children, but [name] and [name] are most involved and assist with decision making. They report that until recently, pt was making her own meals, but family has started providing meals for her. Pt fell at home, fracturing her hip. She pushed her [employer] to call for help. Pt is post op day 1. CSW discussed SNF, including Medicare coverage/criteria. Daughters request [facility], but will look into other facilities as well. SNF list provided.   Employment status:  Retired Forensic scientist:  Medicare PT Recommendations:  [place] / Referral to community resources:  [place]  Patient/Family's Response to care:  Pt and family agreeable to short term SNF at d/c.   Patient/Family's Understanding of and Emotional Response to Diagnosis, Current Treatment, and Prognosis:  Pt's daughters aware of treatment plan, with SNF as part of that plan. They feel pt will adjust fine to SNF as they have already discussed it with pt and she was very receptive.   Emotional Assessment Appearance:  Appears stated age Attitude/Demeanor/Rapport:  Other (very tired) Affect (typically observed):  Appropriate Orientation:  Oriented to Self, Oriented to Place, Oriented to  Time, Oriented to Situation Alcohol / Substance use:  Not Applicable Psych involvement (Current and /or in the community):  No (Comment)  Discharge Needs  Concerns to be addressed:  Discharge Planning Concerns Readmission within the last 30 days:  No Current discharge risk:  Lives alone Barriers to Discharge:  Continued Medical Work up   [employer], [place] 07/20/2015, 11:41 AM [phone]

## 2015-07-20 NOTE — Addendum Note (Signed)
Addendum  created 07/20/15 1340 by Charmaine Downs, CRNA   Modules edited: Notes Section   Notes Section:  File: [id number]

## 2015-07-20 NOTE — Progress Notes (Signed)
Patient with orders to transfuse 1 unit of blood. Hg 9.4 Notified Dr. Luna Glasgow to confirm this order. State he does want 1 unit of RBC transfused. No pre medications ordered.

## 2015-07-21 ENCOUNTER — Inpatient Hospital Stay
Admission: RE | Admit: 2015-07-21 | Discharge: 2015-09-26 | Disposition: A | Discharge status: Home / Self Care | Payer: PRIVATE HEALTH INSURANCE | Source: Ambulatory Visit | Attending: Internal Medicine | Admitting: Internal Medicine

## 2015-07-21 ENCOUNTER — Inpatient Hospital Stay (HOSPITAL_COMMUNITY): Payer: Medicare Other

## 2015-07-21 DIAGNOSIS — Z9889 Other specified postprocedural states: Principal | ICD-10-CM

## 2015-07-21 DIAGNOSIS — T148XXA Other injury of unspecified body region, initial encounter: Secondary | ICD-10-CM

## 2015-07-21 DIAGNOSIS — I10 Essential (primary) hypertension: Secondary | ICD-10-CM

## 2015-07-21 DIAGNOSIS — Z8781 Personal history of (healed) traumatic fracture: Principal | ICD-10-CM

## 2015-07-21 LAB — BASIC METABOLIC PANEL
Anion gap: 6 (ref 5–15)
BUN: 21 mg/dL — ABNORMAL HIGH (ref 6–20)
CALCIUM: 8.5 mg/dL — AB (ref 8.9–10.3)
CHLORIDE: 107 mmol/L (ref 101–111)
CO2: 25 mmol/L (ref 22–32)
CREATININE: 1.17 mg/dL — AB (ref 0.44–1.00)
GFR calc Af Amer: 44 mL/min — ABNORMAL LOW (ref >60)
GFR calc non Af Amer: 38 mL/min — ABNORMAL LOW (ref >60)
GLUCOSE: 115 mg/dL — AB (ref 65–99)
Potassium: 3.8 mmol/L (ref 3.5–5.1)
Sodium: 138 mmol/L (ref 135–145)

## 2015-07-21 LAB — CBC WITH DIFFERENTIAL/PLATELET
Basophils Absolute: 0 10*3/uL (ref 0.0–0.1)
Basophils Relative: 0 % (ref 0–1)
Eosinophils Absolute: 0 10*3/uL (ref 0.0–0.7)
Eosinophils Relative: 0 % (ref 0–5)
HEMATOCRIT: 32.9 % — AB (ref 36.0–46.0)
HEMOGLOBIN: 11.1 g/dL — AB (ref 12.0–15.0)
LYMPHS ABS: 2.2 10*3/uL (ref 0.7–4.0)
LYMPHS PCT: 18 % (ref 12–46)
MCH: 31.6 pg (ref 26.0–34.0)
MCHC: 33.7 g/dL (ref 30.0–36.0)
MCV: 93.7 fL (ref 78.0–100.0)
MONO ABS: 0.9 10*3/uL (ref 0.1–1.0)
MONOS PCT: 7 % (ref 3–12)
NEUTROS ABS: 9.6 10*3/uL — AB (ref 1.7–7.7)
NEUTROS PCT: 75 % (ref 43–77)
Platelets: 182 10*3/uL (ref 150–400)
RBC: 3.51 MIL/uL — ABNORMAL LOW (ref 3.87–5.11)
RDW: 13.9 % (ref 11.5–15.5)
WBC: 12.7 10*3/uL — ABNORMAL HIGH (ref 4.0–10.5)

## 2015-07-21 MED ORDER — LORAZEPAM 1 MG PO TABS: 1.0000 mg | ORAL_TABLET | Freq: Three times a day (TID) | ORAL | Status: DC

## 2015-07-21 MED ORDER — ENOXAPARIN SODIUM 30 MG/0.3ML ~~LOC~~ SOLN: 30.0000 mg | SUBCUTANEOUS | Status: DC

## 2015-07-21 MED ORDER — ACETAMINOPHEN 325 MG PO TABS: 650.0000 mg | ORAL_TABLET | Freq: Four times a day (QID) | ORAL | Status: DC | PRN

## 2015-07-21 NOTE — Clinical Social Work Placement (Signed)
   CLINICAL SOCIAL WORK PLACEMENT  NOTE  Date:  07/21/2015  Patient Details  Name: Amanda Prince MRN: [id number] Date of Birth: 10/02/1917  Clinical Social Work is seeking post-discharge placement for this patient at the [place] level of care (*CSW will initial, date and re-position this form in  chart as items are completed):  Yes   Patient/family provided with [place] Work Department's list of facilities offering this level of care within the geographic area requested by the patient (or if unable, by the patient's family).  Yes   Patient/family informed of their freedom to choose among providers that offer the needed level of care, that participate in Medicare, Medicaid or managed care program needed by the patient, have an available bed and are willing to accept the patient.  Yes   Patient/family informed of [facility]'s ownership interest in [facility] and [facility], as well as of the fact that they are under no obligation to receive care at these facilities.  PASRR submitted to EDS on 07/19/15     PASRR number received on 07/19/15     Existing PASRR number confirmed on       FL2 transmitted to all facilities in geographic area requested by pt/family on 07/20/15     FL2 transmitted to all facilities within larger geographic area on       Patient informed that his/her managed care company has contracts with or will negotiate with certain facilities, including the following:        Yes   Patient/family informed of bed offers received.  Patient chooses bed at [facility]     Physician recommends and patient chooses bed at      Patient to be transferred to [facility] on 07/21/15.  Patient to be transferred to facility by Staff     Patient family notified on 07/21/15 of transfer.  Name of family member notified:  [name]- daughter     PHYSICIAN       Additional Comment:     _______________________________________________ Salome Arnt, [place] 07/21/2015, 10:09 AM [phone]

## 2015-07-21 NOTE — Care Management Note (Signed)
Case Management Note  Patient Details  Name: Amanda Prince MRN: [id number] Date of Birth: 10/02/1917  Expected Discharge Date:  07/21/15               Expected Discharge Plan:  [place]  In-House Referral:  Clinical Social Work  Discharge planning Services  CM Consult  Post Acute Care Choice:  NA Choice offered to:  NA  DME Arranged:    DME Agency:     HH Arranged:    [place] Agency:     Status of Service:  Completed, signed off  Medicare Important Message Given:  Yes-second notification given Date Medicare IM Given:    Medicare IM give by:    Date Additional Medicare IM Given:    Additional Medicare Important Message give by:     If discussed at [place] of Stay Meetings, dates discussed:    Additional Comments: Pt discharging to SNF today. CSW has made arrangements for placement. Pt and family are aware of DC plan and agreeable. No CM needs.  Sherald Barge, RN 07/21/2015, 11:17 AM

## 2015-07-21 NOTE — Progress Notes (Signed)
Subjective: 2 Days Post-Op Procedure(s) (LRB): OPEN REDUCTION INTERNAL FIXATION HIP (Right) Patient reports pain as mild.    Objective: Vital signs in last 24 hours: Temp:  [98.4 F (36.9 C)-101.5 F (38.6 C)] 99.2 F (37.3 C) (08/26 2585) Pulse Rate:  [98-105] 98 (08/26 0632) Resp:  [18-20] 20 (08/26 2778) BP: (108-146)/(41-56) 135/56 mmHg (08/26 0632) SpO2:  [94 %-97 %] 97 % (08/26 2423)  Intake/Output from previous day: 08/25 0701 - 08/26 0700 In: 1000 [P.O.:720; Blood:280] Out: 350 [Urine:350] Intake/Output this shift:     Recent Labs  07/18/15 2220 07/20/15 0559 07/21/15 0615  HGB 9.9* 9.4* 11.1*    Recent Labs  07/20/15 0559 07/21/15 0615  WBC 8.9 12.7*  RBC 2.92* 3.51*  HCT 27.7* 32.9*  PLT 176 182    Recent Labs  07/18/15 2220 07/20/15 0559  NA 139 138  K 4.4 3.8  CL 105 108  CO2 25 25  BUN 28* 19  CREATININE 1.54* 1.31*  GLUCOSE 117* 128*  CALCIUM 9.2 8.4*   No results for input(s): LABPT, INR in the last 72 hours.  Neurologically intact Neurovascular intact Sensation intact distally Intact pulses distally Dorsiflexion/Plantar flexion intact Incision: scant drainage   She did well in PT yesterday but mainly sat in chair.  She received one unit blood and HGB is now 11.1.  Wound looks good.  Hemovac removed.  Foley to be removed.  PT resumes today.  I think she can be discharged to SNF tomorrow.  Assessment/Plan: 2 Days Post-Op Procedure(s) (LRB): OPEN REDUCTION INTERNAL FIXATION HIP (Right) Up with therapy Plan for discharge tomorrow Discharge to SNF   She will need to continue on the enoxaparin daily for one month.  She will need physical therapy and toe touch weight bearing on the right hip with walker.  She will need staples removed from the right hip September 2nd or 3rd with Steri-strips to the hip wound.  I need to see her in my office in one month with x-rays of the right hip done prior to the  appointment.   Bing Duffey 07/21/2015, 7:18 AM

## 2015-07-21 NOTE — Progress Notes (Signed)
Physical Therapy Treatment Patient Details Name: Amanda Prince MRN: [id number] DOB: 10/02/1917 Today's Date: 07/21/2015    History of Present Illness Amanda Prince is an 81 y.o. female with hx of HTN, hypothyroidism, allergy, tripped over her feet and fell complaining of right hip pain. X ray showed right displaced comminuted intertrochanteric Fx. She has CKD and her current Cr is 1.5, slightly above her prior Cr of 1.33 several years ago. Her CXR showed no infiltrate and her UA was negative. Her Hb was 9.9 grams per dL. EKG showed NSR with no acute ST T changes. EDP spoke with Dr Aline Brochure or orthopedics, with plan to proceed with ORIF likely tomorrow, and hospitalist was asked to admit her for same.     PT Comments    Pt was in bed and anxious to get up into a chair.  She was much more oriented today and was very cooperative.  She was able to tolerated therapeutic exercise well but still has difficulty flexing the right hip and knee.  She requires total assist to transfer supine to sit and max assist to stand.  She was able to stand to a walker and finally achieve balance within 1 minute.  She maintained TTWB right.  She required max assist to transfer bed to chair.  She is now able to place more weight on the right hip in sitting and flexes the right hip and knee to about 60 degrees .  She is progressing very well.  Follow Up Recommendations  SNF     Equipment Recommendations  Rolling walker with 5" wheels    Recommendations for Other Services  OT     Precautions / Restrictions Precautions Precautions: Fall Restrictions RLE Weight Bearing: Non weight bearing    Mobility  Bed Mobility Overal bed mobility: Needs Assistance Bed Mobility: Sit to Supine     Supine to sit: HOB elevated;Total assist        Transfers Overall transfer level: Needs assistance Equipment used: Rolling walker (2 wheeled) Transfers: Sit to/from Stand Sit to Stand: Max assist Stand pivot  transfers: Max assist       General transfer comment: pt able to stand to a walker and maintain stance for 1 minute but unable to offload any weight from the LLE in order to take a step...during pivot transfer to a chair she was able to bear weight on the LLE  Ambulation/Gait             General Gait Details: unable yet to ambulate   Stairs            Wheelchair Mobility    Modified Rankin (Stroke Patients Only)       Balance Overall balance assessment: Needs assistance Sitting-balance support: Bilateral upper extremity supported;Feet supported Sitting balance-Leahy Scale: Fair Sitting balance - Comments: able to place more weight on the right hip     Standing balance-Leahy Scale: Fair                      Cognition Arousal/Alertness: Awake/alert Behavior During Therapy: WFL for tasks assessed/performed Overall Cognitive Status: Within Functional Limits for tasks assessed                      Exercises General Exercises - Lower Extremity Ankle Circles/Pumps: Both;10 reps;Supine;AAROM Quad Sets: AROM;Both;10 reps;Supine Gluteal Sets: AROM;Both;10 reps;Supine Short Arc Quad: AROM;10 reps;Supine;Both Heel Slides: AAROM;Both;10 reps;Supine Hip ABduction/ADduction: AAROM;10 reps;Supine;Both    General Comments  Pertinent Vitals/Pain Pain Assessment: No/denies pain    Home Living                      Prior Function            PT Goals (current goals can now be found in the care plan section) Progress towards PT goals: Progressing toward goals    Frequency  Min 6X/week    PT Plan Current plan remains appropriate    Co-evaluation             End of Session Equipment Utilized During Treatment: Gait belt Activity Tolerance: Patient tolerated treatment well;No increased pain Patient left: in chair;with call bell/phone within reach;with chair alarm set;with family/visitor present     Time: 0830-0906 PT Time  Calculation (min) (ACUTE ONLY): 36 min  Charges:  $Therapeutic Exercise: 8-22 mins $Therapeutic Activity: 8-22 mins                    G CodesSable Feil  PT 07/21/2015, 9:21 AM [phone]

## 2015-07-21 NOTE — Progress Notes (Addendum)
Patient being discharged to [facility] today. Santiago Glad, RN from [facility], called and was given report.  Santiago Glad verbalized understanding with no complaints or concerns voiced at this time.  Patient's IV was removed with catheter intact, no bleeding or complications.  Patient's foley catheter was removed this morning.  The patient had not voided since removal of foley.  Bladder scan showed 24ml.  Dr. Jerilee Hoh was notified and ok for patient to discharge.  Patient left unit in stable condition by a staff member in a wheelchair.

## 2015-07-21 NOTE — Discharge Summary (Signed)
Physician Discharge Summary  Amanda Prince IOX:[id number] DOB: 10/02/1917 DOA: 07/18/2015  PCP: Clinton Quant, MD  Admit date: 07/18/2015 Discharge date: 07/21/2015  Time spent: 35 minutes  Recommendations for Outpatient Follow-up:   Per Orthopedic surgeon, Dr.Keeling; continue on the enoxaparin daily for one month, will need physical therapy and toe touch weight bearing on the right hip with walker, staples removed from the right hip September 2nd or 3rd with Steri-strips to the hip wound, and follow up outpatient in one month with x-rays of the right hip done prior to the appointment.  Discharge Diagnoses:  Principal Problem:   Hip fracture, right Active Problems:   Hypothyroidism   HTN (hypertension)   Hip fracture   Discharge Condition: Improved   Filed Weights   07/18/15 2122 07/19/15 0100 07/19/15 0955  Weight: 52.164 kg (115 lb) 52.708 kg (116 lb 3.2 oz) 52.617 kg (116 lb)    History of present illness:  81 y.o. female with history of HTN, hypothyroidism and allergy presented with complaints of hip pain after tripping over her feet and falling,. X ray showed right displaced comminuted intertrochanteric Fx. Workup in the ED also revealed CKD with her creatinine at admission being 1.5, which is slightly above her baseline of 1.33 from several years ago. EDP spoke with Dr Aline Brochure, orthopedics, who planned to proceed with ORIF and hospitalist was asked to admit her for same.   Hospital Course:   Right Hip Fracture -S/p repair by Dr. Luna Glasgow on 8/24. -Will [facility] for rehab purposes. -Complained of some right foot pain, which she stated she injured during her fall. Had some edema and pain of that ankle. Ankle Xray unremarkable. Likely has a mild sprain.  Anemia -Was transfused 1 unit PRBC, per Dr. Luna Glasgow, orthopedic surgeon, recommendation.  -Hbg is trending up, 11.1 9/26  Hypothyroidism -Continue synthroid.  HTN -Well controlled. -Continue  home medications.  Acute on CKD Stage III -Due to prerenal azotemia. -Creatinine returned back to baseline after overnight IVF was given.  Procedures:  Open reduction internal fixation hip (right)  Consultations:  Dr. Luna Glasgow- Orthopedic surgeon   Discharge Instructions      Discharge Instructions    Increase activity slowly    Complete by:  As directed             Medication List    TAKE these medications        acetaminophen 325 MG tablet  Commonly known as:  TYLENOL  Take 2 tablets (650 mg total) by mouth every 6 (six) hours as needed for fever (prn temp greater than 101 F).     enoxaparin 30 MG/0.3ML injection  Commonly known as:  LOVENOX  Inject 0.3 mLs (30 mg total) into the skin daily. For 1 month     fexofenadine 180 MG tablet  Commonly known as:  ALLEGRA  Take 180 mg by mouth daily.     levothyroxine 88 MCG tablet  Commonly known as:  SYNTHROID, LEVOTHROID  Take 88 mcg by mouth daily.     loratadine 10 MG tablet  Commonly known as:  CLARITIN  Take 10 mg by mouth daily.     LORazepam 1 MG tablet  Commonly known as:  ATIVAN  Take 1 tablet (1 mg total) by mouth every 8 (eight) hours.     meclizine 32 MG tablet  Commonly known as:  ANTIVERT  Take 32 mg by mouth 3 (three) times daily as needed.     MIRALAX PO  Take 17 g by mouth daily as needed (constipation).     valsartan-hydrochlorothiazide 80-12.5 MG per tablet  Commonly known as:  DIOVAN-HCT  Take 1 tablet by mouth daily.     VITAMIN B12 PO  Take 1 tablet by mouth daily.     VITAMIN B6 PO  Take 1 tablet by mouth daily.       Allergies  Allergen Reactions  . Sulfa Antibiotics       The results of significant diagnostics from this hospitalization (including imaging, microbiology, ancillary and laboratory) are listed below for reference.    Significant Diagnostic Studies: Dg Chest 1 View  07/18/2015   CLINICAL DATA:  Trip and fall injury tonight. Preoperative examination.  EXAM:  CHEST  1 VIEW  COMPARISON:  05/03/2011  FINDINGS: Shallow inspiration with elevation of the right hemidiaphragm. Heart size and pulmonary vascularity are normal. Diffuse interstitial pattern to the lungs may represent fibrosis or chronic bronchitic changes. No blunting of costophrenic angles. No pneumothorax. No focal consolidation. Calcification of the aorta.  IMPRESSION: Interstitial pattern to the lungs, likely chronic. No evidence of active pulmonary disease.   Electronically Signed   By: Lucienne Capers M.D.   On: 07/18/2015 23:06   Dg Hip Operative Unilat With Pelvis Right  07/19/2015   CLINICAL DATA:  Right hip fracture  EXAM: OPERATIVE right HIP (WITH PELVIS IF PERFORMED) 5 VIEWS  TECHNIQUE: Fluoroscopic spot image(s) were submitted for interpretation post-operatively.  COMPARISON:  None.  FINDINGS: Five spot films were obtained and reveal a fixation sideplate and multiple fixation screws as well as a compression screw traversing the femoral neck. Fracture fragments are in near anatomic alignment.  FLUOROSCOPY TIME:  27 seconds  IMPRESSION: ORIF of proximal right femoral fracture.   Electronically Signed   By: Inez Catalina M.D.   On: 07/19/2015 12:41   Dg Hip Unilat With Pelvis 2-3 Views Right  07/18/2015   CLINICAL DATA:  81 year old female post trip and fall, now with severe right hip pain.  EXAM: DG HIP (WITH OR WITHOUT PELVIS) 2-3V RIGHT  COMPARISON:  None.  FINDINGS: Mildly comminuted intertrochanteric right hip fracture with mild displacement involving the greater and lesser trochanters. Mild shortening and angulation. Femoral head remains seated in the acetabulum. Remainder the bony pelvis is intact.  IMPRESSION: Displaced mildly comminuted intertrochanteric right hip fracture.   Electronically Signed   By: Jeb Levering M.D.   On: 07/18/2015 23:06    Microbiology: Recent Results (from the past 240 hour(s))  Surgical pcr screen     Status: None   Collection Time: 07/19/15  1:24 AM    Result Value Ref Range Status   MRSA, PCR NEGATIVE NEGATIVE Final   Staphylococcus aureus NEGATIVE NEGATIVE Final    Comment:        The Xpert SA Assay (FDA approved for NASAL specimens in patients over 68 years of age), is one component of a comprehensive surveillance program.  Test performance has been validated by [facility] for patients greater than or equal to 4 year old. It is not intended to diagnose infection nor to guide or monitor treatment.      Labs: Basic Metabolic Panel:  Recent Labs Lab 07/18/15 2220 07/20/15 0559 07/21/15 0614  NA 139 138 138  K 4.4 3.8 3.8  CL 105 108 107  CO2 25 25 25   GLUCOSE 117* 128* 115*  BUN 28* 19 21*  CREATININE 1.54* 1.31* 1.17*  CALCIUM 9.2 8.4* 8.5*   CBC:  Recent Labs  Lab 07/18/15 2220 07/20/15 0559 07/21/15 0615  WBC 8.3 8.9 12.7*  NEUTROABS 6.4 6.5 9.6*  HGB 9.9* 9.4* 11.1*  HCT 30.3* 27.7* 32.9*  MCV 99.0 94.9 93.7  PLT 257 176 182    Signed:  Domingo Mend, MD   [facility] Pager: [phone] 07/21/2015, 9:58 AM   I, Laban Emperor. Leonie Green, acting as scribe, recorded this note contemporaneously in the presence of Dr. Domingo Mend, M.D. on 07/21/2015.  \I have reviewed the above documentation for accuracy and completeness, and I agree with the above.  Domingo Mend, MD [facility] Pager: [phone]

## 2015-07-21 NOTE — Care Management Important Message (Signed)
Important Message  Patient Details  Name: Amanda Prince MRN: [id number] Date of Birth: 10/02/1917   Medicare Important Message Given:  Yes-second notification given    Sherald Barge, RN 07/21/2015, 11:16 AM

## 2015-07-22 ENCOUNTER — Encounter (HOSPITAL_COMMUNITY)
Admission: RE | Admit: 2015-07-22 | Discharge: 2015-07-22 | Disposition: A | Discharge status: Home / Self Care | Payer: Medicare Other | Source: Skilled Nursing Facility | Attending: Internal Medicine | Admitting: Internal Medicine

## 2015-07-22 LAB — CBC WITH DIFFERENTIAL/PLATELET
BASOS ABS: 0 10*3/uL (ref 0.0–0.1)
Basophils Relative: 0 % (ref 0–1)
EOS PCT: 0 % (ref 0–5)
Eosinophils Absolute: 0 10*3/uL (ref 0.0–0.7)
HCT: 29.4 % — ABNORMAL LOW (ref 36.0–46.0)
Hemoglobin: 10 g/dL — ABNORMAL LOW (ref 12.0–15.0)
LYMPHS PCT: 16 % (ref 12–46)
Lymphs Abs: 2.2 10*3/uL (ref 0.7–4.0)
MCH: 32.2 pg (ref 26.0–34.0)
MCHC: 34 g/dL (ref 30.0–36.0)
MCV: 94.5 fL (ref 78.0–100.0)
MONO ABS: 1.1 10*3/uL — AB (ref 0.1–1.0)
Monocytes Relative: 8 % (ref 3–12)
Neutro Abs: 10.4 10*3/uL — ABNORMAL HIGH (ref 1.7–7.7)
Neutrophils Relative %: 76 % (ref 43–77)
PLATELETS: 213 10*3/uL (ref 150–400)
RBC: 3.11 MIL/uL — ABNORMAL LOW (ref 3.87–5.11)
RDW: 13.7 % (ref 11.5–15.5)
WBC: 13.7 10*3/uL — ABNORMAL HIGH (ref 4.0–10.5)

## 2015-07-22 LAB — TYPE AND SCREEN
ABO/RH(D): O POS
Antibody Screen: NEGATIVE
UNIT DIVISION: 0
UNIT DIVISION: 0
Unit division: 0

## 2015-07-23 ENCOUNTER — Non-Acute Institutional Stay (SKILLED_NURSING_FACILITY): Payer: Medicare Other | Admitting: Internal Medicine

## 2015-07-23 DIAGNOSIS — S72142D Displaced intertrochanteric fracture of left femur, subsequent encounter for closed fracture with routine healing: Secondary | ICD-10-CM | POA: Diagnosis not present

## 2015-07-23 DIAGNOSIS — N183 Chronic kidney disease, stage 3 (moderate): Secondary | ICD-10-CM | POA: Diagnosis not present

## 2015-07-23 DIAGNOSIS — R5383 Other fatigue: Secondary | ICD-10-CM

## 2015-07-23 DIAGNOSIS — E039 Hypothyroidism, unspecified: Secondary | ICD-10-CM | POA: Diagnosis not present

## 2015-07-23 NOTE — Progress Notes (Signed)
Patient ID: Amanda Prince, female   DOB: 10/02/1917, 81 y.o.   MRN: [id number]  Facility; [facility] Chief complaint; admission to SNF post admit to [facility] from 8/23 to 8/26  History; this is a patient to is 81 years old lives in her own home and [place]. Reasonably independent with ADLs with family support for IADLs. She has a walker that she does not use. She fell on the evening of admission and presented to hospital with an x-ray showing a right displaced comminuted intertrochanteric fracture. She underwent an ORIF by Dr. Luna Glasgow. She complained of right foot and ankle pain. An x-ray was negative. She was felt to have a sprain. She was transfused 1 unit of packed red blood cells.  Since her arrival in the building she was noted to be very lethargic yesterday. She is on Ativan 1 mg 3 times a day and looking at the hospital records it states that she was taking this at home in the same manner. Over in discussing things with the family and it is apparent that she was only taking this once at at bedtime. She has not received any Ativan since last night  CBC Latest Ref Rng 07/22/2015 07/21/2015 07/20/2015  WBC 4.0 - 10.5 K/uL 13.7(H) 12.7(H) 8.9  Hemoglobin 12.0 - 15.0 g/dL 10.0(L) 11.1(L) 9.4(L)  Hematocrit 36.0 - 46.0 % 29.4(L) 32.9(L) 27.7(L)  Platelets 150 - 400 K/uL 213 182 176    Lab Results  Component Value Date   CREATININE 1.17* 07/21/2015   CREATININE 1.31* 07/20/2015   CREATININE 1.54* 07/18/2015   Past Medical History  Diagnosis Date  . HTN (hypertension)   . Thyroid disease   . Seasonal allergies   . Vertigo   . Anxiety   . Kidney stone   . Arthritis    Past Surgical History  Procedure Laterality Date  . Kidney stones    . Gallbladder surgery    . Otif right wrist  Dr Aline Brochure 2012  . Wrist fracture surgery    . Orif hip fracture Right 07/19/2015    Procedure: OPEN REDUCTION INTERNAL FIXATION HIP;  Surgeon: Sanjuana Kava, MD;  Location: [facility];  Service:  Orthopedics;  Laterality: Right;   Current Outpatient Prescriptions on File Prior to Visit  Medication Sig Dispense Refill  . acetaminophen (TYLENOL) 325 MG tablet Take 2 tablets (650 mg total) by mouth every 6 (six) hours as needed for fever (prn temp greater than 101 F).    . Cyanocobalamin (VITAMIN B12 PO) Take 1 tablet by mouth daily.     Marland Kitchen enoxaparin (LOVENOX) 30 MG/0.3ML injection Inject 0.3 mLs (30 mg total) into the skin daily. For 1 month 0 Syringe   . fexofenadine (ALLEGRA) 180 MG tablet Take 180 mg by mouth daily.      Marland Kitchen levothyroxine (SYNTHROID, LEVOTHROID) 88 MCG tablet Take 88 mcg by mouth daily.      Marland Kitchen loratadine (CLARITIN) 10 MG tablet Take 10 mg by mouth daily.      Marland Kitchen LORazepam (ATIVAN) 1 MG tablet Take 1 tablet (1 mg total) by mouth every 8 (eight) hours. 30 tablet 0  . meclizine (ANTIVERT) 32 MG tablet Take 32 mg by mouth 3 (three) times daily as needed.      . Polyethylene Glycol 3350 (MIRALAX PO) Take 17 g by mouth daily as needed (constipation).     . Pyridoxine HCl (VITAMIN B6 PO) Take 1 tablet by mouth daily.     . valsartan-hydrochlorothiazide (DIOVAN-HCT) 80-12.5 MG per  tablet Take 1 tablet by mouth daily.        Social; as noted the patient lives at home and [place] on her own. She has a walker that she does not use she does not have a history of falling. Reasonably independent in ADLs. Does not get out of the home much. Family takes care of IADLs.  Family History  Problem Relation Age of Onset  . Heart disease       Review of systems; Gen; no weight loss HEENT: uses glasses Respiratory; no shortness of breath, no cough, Cardiac; no exertional chest pain, no palpitations GI; no abnormal pain, no change in bowel habits GU; no dysuria, no voiding difficulties Musculoskeletal; was complaining of right foot and ankle pain as noted in x-ray was negative Endocrine; patient is on replacement. No issues reported. TSH in the hospital was  0.861 Neurologic; no focal complaints no weakness no numbness Mental status; as noted that she was very lethargic yesterday the Ativan was held  Physical examination Gen. patient is awake alert cognizant. HEENT oral exam is normal Respiratory clear entry bilaterally Cardiac heart sounds are normal 2/6 systolic ejection murmur no gallops. She appears to be euvolemic no carotid bruits Abdomen no liver no spleen no tenderness no masses GU bladder is not distended there is no CVA tenderness. Musculoskeletal; right hip incision is clean and well approximated. There is old bruising in the medial aspect of the leg no doubt postoperative loss. She does have a small effusion in the right knee. I can find no abnormalities in the right foot or ankle to be concerned with the. The ankle moves well. Neurologic; she does have weakness in the left hip but flexor and abductor. Reflexes are symmetric Mental status I see no evidence of delirium or depression  Impression/plan #1 right hip fracture status post operative repair, ORIF. This appears to be healing well. She is on Lovenox for DVT prophylaxis. #2 lethargy probably secondary to Ativan. In discussion with the family she was only on this once at night. As she has been on this at 3 times a day presumably for last 5 days all taper this down to 0.5 daily at bedtime #3 anemia she was transfused 1 unit to her hemoglobin is stable. It looks as though she has chronic normochromic normocytic anemia. #4 leukocytosis. Her white count yesterday was 13.7 with an essentially normal differential. The cause of this is not really clear. We'll continue to monitor. #5 screen for vitamin D deficiency in this homebound elderly woman #6 hypothyroidism on replacement. Her TSH was within normal limits during this hospitalization. #7 she is on both Allegra and Claritin. I think one of these can probably be discontinued. #8 history of vitamin B12 deficiency on replacement #9  chronic renal failure stage III. This does not appear to be unstable.  The patient is a good candidate for rehabilitation. I suspect she has bilateral lower extremity weakness which will need work. There does not appear to be a cognitive issues here I'm going to taper the Ativan. She appears to have tolerated stopping this already

## 2015-07-26 ENCOUNTER — Encounter (HOSPITAL_COMMUNITY)
Admission: AD | Admit: 2015-07-26 | Discharge: 2015-07-26 | Disposition: A | Discharge status: Home / Self Care | Payer: Medicare Other | Source: Skilled Nursing Facility | Attending: Internal Medicine | Admitting: Internal Medicine

## 2015-07-26 LAB — CBC WITH DIFFERENTIAL/PLATELET
BASOS ABS: 0 10*3/uL (ref 0.0–0.1)
BASOS PCT: 0 % (ref 0–1)
EOS ABS: 0.2 10*3/uL (ref 0.0–0.7)
Eosinophils Relative: 2 % (ref 0–5)
HCT: 29.1 % — ABNORMAL LOW (ref 36.0–46.0)
HEMOGLOBIN: 9.9 g/dL — AB (ref 12.0–15.0)
LYMPHS ABS: 2.7 10*3/uL (ref 0.7–4.0)
Lymphocytes Relative: 28 % (ref 12–46)
MCH: 32.2 pg (ref 26.0–34.0)
MCHC: 34 g/dL (ref 30.0–36.0)
MCV: 94.8 fL (ref 78.0–100.0)
Monocytes Absolute: 0.9 10*3/uL (ref 0.1–1.0)
Monocytes Relative: 9 % (ref 3–12)
NEUTROS PCT: 61 % (ref 43–77)
Neutro Abs: 5.9 10*3/uL (ref 1.7–7.7)
Platelets: 386 10*3/uL (ref 150–400)
RBC: 3.07 MIL/uL — AB (ref 3.87–5.11)
RDW: 13.4 % (ref 11.5–15.5)
WBC: 9.7 10*3/uL (ref 4.0–10.5)

## 2015-07-27 LAB — VITAMIN D 25 HYDROXY (VIT D DEFICIENCY, FRACTURES): Vit D, 25-Hydroxy: 19.6 ng/mL — ABNORMAL LOW (ref 30.0–100.0)

## 2015-08-04 ENCOUNTER — Encounter (HOSPITAL_COMMUNITY)
Admission: AD | Admit: 2015-08-04 | Discharge: 2015-08-04 | Disposition: A | Discharge status: Home / Self Care | Payer: Medicare Other | Source: Skilled Nursing Facility | Attending: Internal Medicine | Admitting: Internal Medicine

## 2015-08-04 ENCOUNTER — Encounter: Payer: Self-pay | Admitting: Internal Medicine

## 2015-08-04 ENCOUNTER — Non-Acute Institutional Stay (SKILLED_NURSING_FACILITY): Payer: Medicare Other | Admitting: Internal Medicine

## 2015-08-04 DIAGNOSIS — D638 Anemia in other chronic diseases classified elsewhere: Secondary | ICD-10-CM | POA: Diagnosis not present

## 2015-08-04 DIAGNOSIS — I10 Essential (primary) hypertension: Secondary | ICD-10-CM

## 2015-08-04 DIAGNOSIS — D62 Acute posthemorrhagic anemia: Secondary | ICD-10-CM | POA: Diagnosis not present

## 2015-08-04 DIAGNOSIS — N289 Disorder of kidney and ureter, unspecified: Secondary | ICD-10-CM

## 2015-08-04 LAB — BASIC METABOLIC PANEL
Anion gap: 6 (ref 5–15)
BUN: 35 mg/dL — AB (ref 6–20)
CALCIUM: 8.4 mg/dL — AB (ref 8.9–10.3)
CO2: 27 mmol/L (ref 22–32)
CREATININE: 1.6 mg/dL — AB (ref 0.44–1.00)
Chloride: 106 mmol/L (ref 101–111)
GFR calc non Af Amer: 26 mL/min — ABNORMAL LOW (ref >60)
GFR, EST AFRICAN AMERICAN: 30 mL/min — AB (ref >60)
Glucose, Bld: 102 mg/dL — ABNORMAL HIGH (ref 65–99)
Potassium: 4.5 mmol/L (ref 3.5–5.1)
SODIUM: 139 mmol/L (ref 135–145)

## 2015-08-04 LAB — CBC WITH DIFFERENTIAL/PLATELET
BASOS PCT: 1 % (ref 0–1)
Basophils Absolute: 0.1 10*3/uL (ref 0.0–0.1)
EOS ABS: 0.5 10*3/uL (ref 0.0–0.7)
EOS PCT: 6 % — AB (ref 0–5)
HCT: 27.6 % — ABNORMAL LOW (ref 36.0–46.0)
HEMOGLOBIN: 9.2 g/dL — AB (ref 12.0–15.0)
Lymphocytes Relative: 26 % (ref 12–46)
Lymphs Abs: 2 10*3/uL (ref 0.7–4.0)
MCH: 32.7 pg (ref 26.0–34.0)
MCHC: 33.3 g/dL (ref 30.0–36.0)
MCV: 98.2 fL (ref 78.0–100.0)
MONOS PCT: 8 % (ref 3–12)
Monocytes Absolute: 0.6 10*3/uL (ref 0.1–1.0)
NEUTROS PCT: 59 % (ref 43–77)
Neutro Abs: 4.5 10*3/uL (ref 1.7–7.7)
PLATELETS: 375 10*3/uL (ref 150–400)
RBC: 2.81 MIL/uL — ABNORMAL LOW (ref 3.87–5.11)
RDW: 13.9 % (ref 11.5–15.5)
WBC: 7.6 10*3/uL (ref 4.0–10.5)

## 2015-08-04 NOTE — Progress Notes (Signed)
Patient ID: Amanda Prince, female   DOB: 10/02/1917, 81 y.o.   MRN: [id number]   Facility; [facility] This is an acute visit  Chief complaint--acute visit follow-up anemia renal insufficiency  HPI; this is a patient to is 81 years old female She fell on the evening of admission and presented to hospital with an x-ray showing a right displaced comminuted intertrochanteric fracture. She underwent an ORIF by Dr. Luna Glasgow. She complained of right foot and ankle pain. An x-ray was negative. She was felt to have a sprain. She was transfused 1 unit of packed red blood cells. Hemoglobin in the hospital varied from 9.4-11.1 on the hospital admission it was 9.9 per chart review.  Updated CBC today shows hemoglobin is 9.2 this is high normocytic at MCV 98.2.  She does not complain of any increased weakness palpitations she is working with physical therapy.  Also note that she appears to have some baseline renal insufficiency creatinine was 1.5 on hospital admission it is 1.6 on lab done today with the BUN of 35.  She is on : Diovan         CBC Latest Ref Rng 07/22/2015 07/21/2015 07/20/2015  WBC 4.0 - 10.5 K/uL 13.7(H) 12.7(H) 8.9  Hemoglobin 12.0 - 15.0 g/dL 10.0(L) 11.1(L) 9.4(L)  Hematocrit 36.0 - 46.0 % 29.4(L) 32.9(L) 27.7(L)  Platelets 150 - 400 K/uL 213 182 176    Lab Results  08/04/2015.  Sodium 139 potassium 4.5 BUN 35 creatinine 1.6.  WBC 7.6 hemoglobin 9.2 platelets 375    Component Value Date   CREATININE 1.17* 07/21/2015   CREATININE 1.31* 07/20/2015   CREATININE 1.54* 07/18/2015   Past Medical History  Diagnosis Date  . HTN (hypertension)   . Thyroid disease   . Seasonal allergies   . Vertigo   . Anxiety   . Kidney stone   . Arthritis    Past Surgical History  Procedure Laterality Date  . Kidney stones    . Gallbladder surgery    . Otif right wrist  Dr Aline Brochure 2012  . Wrist fracture surgery    . Orif hip fracture Right 07/19/2015    Procedure: OPEN  REDUCTION INTERNAL FIXATION HIP;  Surgeon: Sanjuana Kava, MD;  Location: [facility];  Service: Orthopedics;  Laterality: Right;   Current Outpatient Prescriptions on File Prior to Visit  Medication Sig Dispense Refill  . acetaminophen (TYLENOL) 325 MG tablet Take 2 tablets (650 mg total) by mouth every 6 (six) hours as needed for fever (prn temp greater than 101 F).    . Cyanocobalamin (VITAMIN B12 PO) Take 1 tablet by mouth daily.     Marland Kitchen enoxaparin (LOVENOX) 30 MG/0.3ML injection Inject 0.3 mLs (30 mg total) into the skin daily. For 1 month 0 Syringe   . fexofenadine (ALLEGRA) 180 MG tablet Take 180 mg by mouth daily.      Marland Kitchen levothyroxine (SYNTHROID, LEVOTHROID) 88 MCG tablet Take 88 mcg by mouth daily.      Marland Kitchen loratadine (CLARITIN) 10 MG tablet Take 10 mg by mouth daily.      Marland Kitchen LORazepam (ATIVAN) 1 MG tablet Take 1 tablet (1 mg total) by mouth every 8 (eight) hours. 30 tablet 0  . meclizine (ANTIVERT) 32 MG tablet Take 32 mg by mouth 3 (three) times daily as needed.      . Polyethylene Glycol 3350 (MIRALAX PO) Take 17 g by mouth daily as needed (constipation).     . Pyridoxine HCl (VITAMIN B6 PO) Take 1 tablet  by mouth daily.     . valsartan-hydrochlorothiazide (DIOVAN-HCT) 80-12.5 MG per tablet Take 1 tablet by mouth daily.        Social; as noted the patient lives at home and [place] on her own. She has a walker that she does not use she does not have a history of falling. Reasonably independent in ADLs. Does not get out of the home much. Family takes care of IADLs.  Family History  Problem Relation Age of Onset  . Heart disease       Review of systems; Gen; no  complaints of fever or chills HEENT: uses glasses Respiratory; no shortness of breath, no cough, Cardiac; no exertional chest pain, no palpitations GI; no abnormal pain, no change in bowel habits GU; no dysuria, no voiding difficulties Musculoskeletal; was complaining of right foot and ankle pain as noted in  x-ray was negative Endocrine; patient is on replacement. No issues reported. TSH in the hospital was 0.861 Neurologic; no focal complaints no weakness no numbness Mental status; lethargy appears to have resolved status post reduction of her Ativan  Physical examination Temperature 97.8 pulse 100 respirations 16 blood pressure 120/55 Gen. patient is awake alert cognizant and comfortably in her chair. HEENT oral exam is normal Respiratory clear entry bilaterally Cardiac heart sounds are normal 2/6 systolic ejection murmur no gallops. Patient appears to have a partially 1+ edema on the right leg however this has improved since her hospitalization according to patient and her daughter Abdomen  Soft nontender with positive bowel sounds GU bladder is not distended there is no CVA tenderness. Musculoskeletal;  Again limited mobility of her right leg secondary to recent surgery-otherwise moves extremities appears at baseline with appropriate strength and range of motion Neurologic is grossly intact her speech is clear no lateralizing findings.  Psych she is alert and oriented pleasant and appropriate  Labs as noted above  Impression/plan #1 right hip fracture status post operative repair, ORIF.  This appears to be stable she is working with therapy she is on Lovenox for anticoagulation #2 lethargy probably secondary to Ativan. In discussion with the family she was only on this once at night. As she has been on this at 3 times a day presumably for last 5 days all taper this down to 0.5 daily at bedtime #3 anemia she was transfused 1 unit in the hospital-hemoglobin today is 9.2 which is a slight drop will start her on iron and recheck this next week  It looks as though she has chronic normochromic normocytic anemia. #4 leukocytosis.  Her white count initially was 13.7 this has normalized at 7.6 on lab done today   #5 chronic renal failure stage III. Appears  her creatinine 1.6 is slightly above  her baseline Will recheck this next week as well--this was discussed with Dr. Dellia Nims via phone  #6-hypertension this appears stable recent blood pressures 120/55-126/62-we'll continue the Diovan for now   [id number]--of note greater than 25 minutes spent assessing patient reviewing her chart-and coordinating formulating a plan of care for numerous diagnoses-no greater than 50% of time spent coordinating plan of care

## 2015-08-08 ENCOUNTER — Other Ambulatory Visit (HOSPITAL_COMMUNITY)
Admission: AD | Admit: 2015-08-08 | Discharge: 2015-08-08 | Disposition: A | Discharge status: Home / Self Care | Payer: No Typology Code available for payment source | Source: Skilled Nursing Facility | Attending: Internal Medicine | Admitting: Internal Medicine

## 2015-08-08 DIAGNOSIS — D649 Anemia, unspecified: Secondary | ICD-10-CM | POA: Diagnosis present

## 2015-08-08 DIAGNOSIS — I10 Essential (primary) hypertension: Secondary | ICD-10-CM | POA: Diagnosis present

## 2015-08-08 LAB — CBC
HCT: 30.7 % — ABNORMAL LOW (ref 36.0–46.0)
HEMOGLOBIN: 10.2 g/dL — AB (ref 12.0–15.0)
MCH: 32.8 pg (ref 26.0–34.0)
MCHC: 33.2 g/dL (ref 30.0–36.0)
MCV: 98.7 fL (ref 78.0–100.0)
PLATELETS: 432 10*3/uL — AB (ref 150–400)
RBC: 3.11 MIL/uL — AB (ref 3.87–5.11)
RDW: 14.3 % (ref 11.5–15.5)
WBC: 6.4 10*3/uL (ref 4.0–10.5)

## 2015-08-08 LAB — BASIC METABOLIC PANEL
ANION GAP: 6 (ref 5–15)
BUN: 32 mg/dL — ABNORMAL HIGH (ref 6–20)
CALCIUM: 8.8 mg/dL — AB (ref 8.9–10.3)
CO2: 28 mmol/L (ref 22–32)
CREATININE: 1.52 mg/dL — AB (ref 0.44–1.00)
Chloride: 102 mmol/L (ref 101–111)
GFR calc non Af Amer: 28 mL/min — ABNORMAL LOW (ref >60)
GFR, EST AFRICAN AMERICAN: 32 mL/min — AB (ref >60)
Glucose, Bld: 139 mg/dL — ABNORMAL HIGH (ref 65–99)
Potassium: 4.6 mmol/L (ref 3.5–5.1)
SODIUM: 136 mmol/L (ref 135–145)

## 2015-08-09 LAB — CBC
HEMATOCRIT: 32.4 % — AB (ref 36.0–46.0)
HEMOGLOBIN: 10.7 g/dL — AB (ref 12.0–15.0)
MCH: 32.6 pg (ref 26.0–34.0)
MCHC: 33 g/dL (ref 30.0–36.0)
MCV: 98.8 fL (ref 78.0–100.0)
Platelets: 482 10*3/uL — ABNORMAL HIGH (ref 150–400)
RBC: 3.28 MIL/uL — AB (ref 3.87–5.11)
RDW: 14.1 % (ref 11.5–15.5)
WBC: 7.1 10*3/uL (ref 4.0–10.5)

## 2015-08-14 ENCOUNTER — Encounter (HOSPITAL_COMMUNITY)
Admission: RE | Admit: 2015-08-14 | Discharge: 2015-08-14 | Disposition: A | Discharge status: Home / Self Care | Payer: Medicare Other | Source: Skilled Nursing Facility | Attending: Internal Medicine | Admitting: Internal Medicine

## 2015-08-14 LAB — BASIC METABOLIC PANEL
ANION GAP: 4 — AB (ref 5–15)
BUN: 25 mg/dL — ABNORMAL HIGH (ref 6–20)
CO2: 28 mmol/L (ref 22–32)
Calcium: 8.5 mg/dL — ABNORMAL LOW (ref 8.9–10.3)
Chloride: 106 mmol/L (ref 101–111)
Creatinine, Ser: 1.64 mg/dL — ABNORMAL HIGH (ref 0.44–1.00)
GFR, EST AFRICAN AMERICAN: 30 mL/min — AB (ref >60)
GFR, EST NON AFRICAN AMERICAN: 25 mL/min — AB (ref >60)
Glucose, Bld: 90 mg/dL (ref 65–99)
POTASSIUM: 4.3 mmol/L (ref 3.5–5.1)
SODIUM: 138 mmol/L (ref 135–145)

## 2015-08-18 ENCOUNTER — Ambulatory Visit (HOSPITAL_COMMUNITY)
Admit: 2015-08-18 | Discharge: 2015-08-18 | Disposition: A | Discharge status: Home / Self Care | Payer: No Typology Code available for payment source | Source: Ambulatory Visit | Attending: Orthopaedic Surgery | Admitting: Orthopaedic Surgery

## 2015-08-18 DIAGNOSIS — S72141D Displaced intertrochanteric fracture of right femur, subsequent encounter for closed fracture with routine healing: Secondary | ICD-10-CM | POA: Insufficient documentation

## 2015-08-18 DIAGNOSIS — R937 Abnormal findings on diagnostic imaging of other parts of musculoskeletal system: Secondary | ICD-10-CM | POA: Diagnosis not present

## 2015-08-18 DIAGNOSIS — X58XXXD Exposure to other specified factors, subsequent encounter: Secondary | ICD-10-CM | POA: Diagnosis not present

## 2015-09-11 ENCOUNTER — Non-Acute Institutional Stay (SKILLED_NURSING_FACILITY): Payer: Medicare Other | Admitting: Internal Medicine

## 2015-09-11 DIAGNOSIS — I952 Hypotension due to drugs: Secondary | ICD-10-CM | POA: Diagnosis not present

## 2015-09-11 DIAGNOSIS — R110 Nausea: Secondary | ICD-10-CM | POA: Diagnosis not present

## 2015-09-11 NOTE — Progress Notes (Signed)
Patient ID: Amanda Prince, female   DOB: 10/02/1917, 81 y.o.   MRN: [id number] Facility; [facility] Chief complaint low blood pressures. History; this is a somewhat frail 81 year old woman who came to his initially after suffering a comminuted intertrochanteric Fracture of the right hip. She underwent operative repair. She's been in the building rehabilitating. She has a history of hypertension also vertigo. The patient is on Diovan/hydrochlorothiazide 80/12.5 daily. Nurses report that her blood pressures have been low in the 80 range in the morning. There is several examples of this. 2 days ago blood pressure was 102/50. The patient has no specific complaints of chest pain or shortness of breath. Her major complaint is that of nausea without vomiting. She feels anorectic and cannot eat all her meals.  BMP Latest Ref Rng 08/14/2015 08/08/2015 08/04/2015  Glucose 65 - 99 mg/dL 90 139(H) 102(H)  BUN 6 - 20 mg/dL 25(H) 32(H) 35(H)  Creatinine 0.44 - 1.00 mg/dL 1.64(H) 1.52(H) 1.60(H)  Sodium 135 - 145 mmol/L 138 136 139  Potassium 3.5 - 5.1 mmol/L 4.3 4.6 4.5  Chloride 101 - 111 mmol/L 106 102 106  CO2 22 - 32 mmol/L 28 28 27   Calcium 8.9 - 10.3 mg/dL 8.5(L) 8.8(L) 8.4(L)   CBC Latest Ref Rng 08/09/2015 08/08/2015 08/04/2015  WBC 4.0 - 10.5 K/uL 7.1 6.4 7.6  Hemoglobin 12.0 - 15.0 g/dL 10.7(L) 10.2(L) 9.2(L)  Hematocrit 36.0 - 46.0 % 32.4(L) 30.7(L) 27.6(L)  Platelets 150 - 400 K/uL 482(H) 432(H) 375   Review of systems Gen. patient states she feels weak think she has a head cold HEENT nasal congestion Respiratory no cough no sputum Cardiac no chest pain GI states that she feels like she can't eat due to nausea but no vomiting. However she feels that she does force herself to eat it would "come back up" GU no dysuria.  Physical examination Gen. O2 sat is 96% on room air respirations 18, pulse rate 81 Respiratory; clear entry bilaterally Cardiac she does not appear to be dehydrated. 2/6  pansystolic murmur Abdomen no liver or spleen no tenderness. GU no suprapubic or costovertebral angle tenderness Extremities no evidence of a DVT  Impression/plan #1 low blood pressures. This may be her medication which probably should be put on hold. She will need lab work. She does not appear to be medically ill there is no evidence of cardiac instability. #2 anorexia; she ate very little breakfast . She will need lab work checked.

## 2015-09-13 ENCOUNTER — Encounter (HOSPITAL_COMMUNITY)
Admission: AD | Admit: 2015-09-13 | Discharge: 2015-09-13 | Disposition: A | Discharge status: Home / Self Care | Payer: Medicare Other | Source: Skilled Nursing Facility | Attending: Internal Medicine | Admitting: Internal Medicine

## 2015-09-13 LAB — COMPREHENSIVE METABOLIC PANEL
ALT: 17 U/L (ref 14–54)
ANION GAP: 8 (ref 5–15)
AST: 32 U/L (ref 15–41)
Albumin: 3.3 g/dL — ABNORMAL LOW (ref 3.5–5.0)
Alkaline Phosphatase: 117 U/L (ref 38–126)
BUN: 36 mg/dL — ABNORMAL HIGH (ref 6–20)
CHLORIDE: 105 mmol/L (ref 101–111)
CO2: 25 mmol/L (ref 22–32)
CREATININE: 1.6 mg/dL — AB (ref 0.44–1.00)
Calcium: 9.3 mg/dL (ref 8.9–10.3)
GFR, EST AFRICAN AMERICAN: 30 mL/min — AB (ref >60)
GFR, EST NON AFRICAN AMERICAN: 26 mL/min — AB (ref >60)
Glucose, Bld: 100 mg/dL — ABNORMAL HIGH (ref 65–99)
Potassium: 4.3 mmol/L (ref 3.5–5.1)
SODIUM: 138 mmol/L (ref 135–145)
Total Bilirubin: 0.5 mg/dL (ref 0.3–1.2)
Total Protein: 6.7 g/dL (ref 6.5–8.1)

## 2015-09-13 LAB — CBC
HCT: 31.5 % — ABNORMAL LOW (ref 36.0–46.0)
HEMOGLOBIN: 10.2 g/dL — AB (ref 12.0–15.0)
MCH: 31.9 pg (ref 26.0–34.0)
MCHC: 32.4 g/dL (ref 30.0–36.0)
MCV: 98.4 fL (ref 78.0–100.0)
PLATELETS: 422 10*3/uL — AB (ref 150–400)
RBC: 3.2 MIL/uL — AB (ref 3.87–5.11)
RDW: 13.6 % (ref 11.5–15.5)
WBC: 7.6 10*3/uL (ref 4.0–10.5)

## 2015-09-15 ENCOUNTER — Ambulatory Visit (HOSPITAL_COMMUNITY): Payer: No Typology Code available for payment source | Attending: Internal Medicine

## 2015-09-15 DIAGNOSIS — X58XXXD Exposure to other specified factors, subsequent encounter: Secondary | ICD-10-CM | POA: Diagnosis not present

## 2015-09-15 DIAGNOSIS — M25751 Osteophyte, right hip: Secondary | ICD-10-CM | POA: Insufficient documentation

## 2015-09-15 DIAGNOSIS — S72141D Displaced intertrochanteric fracture of right femur, subsequent encounter for closed fracture with routine healing: Secondary | ICD-10-CM | POA: Insufficient documentation

## 2015-09-21 ENCOUNTER — Non-Acute Institutional Stay (SKILLED_NURSING_FACILITY): Payer: Medicare Other | Admitting: Internal Medicine

## 2015-09-21 ENCOUNTER — Encounter: Payer: Self-pay | Admitting: Internal Medicine

## 2015-09-21 DIAGNOSIS — I10 Essential (primary) hypertension: Secondary | ICD-10-CM | POA: Diagnosis not present

## 2015-09-21 DIAGNOSIS — D62 Acute posthemorrhagic anemia: Secondary | ICD-10-CM

## 2015-09-21 DIAGNOSIS — E038 Other specified hypothyroidism: Secondary | ICD-10-CM | POA: Diagnosis not present

## 2015-09-21 DIAGNOSIS — S72001D Fracture of unspecified part of neck of right femur, subsequent encounter for closed fracture with routine healing: Secondary | ICD-10-CM | POA: Diagnosis not present

## 2015-09-21 NOTE — Progress Notes (Signed)
Patient ID: Amanda Prince, female   DOB: 10/02/1917, 81 y.o.   MRN: [id number]    Facility; [facility] This is a a discharge visit  Chief complaint--discharge note  HPI; this is a patient to is 81 years old female She fell on the evening of admission and presented to hospital with an x-ray showing a right displaced comminuted intertrochanteric fracture. She underwent an ORIF by Dr. Luna Glasgow. She complained of right foot and ankle pain. An x-ray was negative. She was felt to have a sprain. She was transfused 1 unit of packed red blood cells. Hemoglobin in the hospital varied from 9.4-11.1 on the hospital admission it was 9.9 per chart review-she recently was 10.2 on October 19 she is on iron.     She does not complain of any increased weakness palpitations she is working with physical therapy. We'll need continued therapy at home secondary to some continued weakness--she will be going home with her spouse  Also note that she appears to have some baseline renal insufficiency creatinine was 1.5 on hospital admission it is 1.6 on lab done on 09/13/2015 this appears to be stable  She was on : Diovan/hydrochlorothiazide for hypertension however she was somewhat hypotensive and this was discontinued nonetheless her pressures appear to be stable most recently 124/64-118/73-102/50  Today she has no acute complaints         CBC Latest Ref Rng 07/22/2015 07/21/2015 07/20/2015  WBC 4.0 - 10.5 K/uL 13.7(H) 12.7(H) 8.9  Hemoglobin 12.0 - 15.0 g/dL 10.0(L) 11.1(L) 9.4(L)  Hematocrit 36.0 - 46.0 % 29.4(L) 32.9(L) 27.7(L)  Platelets 150 - 400 K/uL 213 182 176    Lab Results  09/13/2015.  WBC 7.6 hemoglobin 10.2 platelets 422.  Sodium 138 potassium 4.3 BUN 36 creatinine 1.6.  Albumin 3.3 otherwise liver function tests within normal limits  08/04/2015.  Sodium 139 potassium 4.5 BUN 35 creatinine 1.6. WBC 7.6 hemoglobin 9.2 platelets 375    Component Value Date   CREATININE 1.17*  07/21/2015   CREATININE 1.31* 07/20/2015   CREATININE 1.54* 07/18/2015   Past Medical History  Diagnosis Date  . HTN (hypertension)   . Thyroid disease   . Seasonal allergies   . Vertigo   . Anxiety   . Kidney stone   . Arthritis    Past Surgical History  Procedure Laterality Date  . Kidney stones    . Gallbladder surgery    . Otif right wrist  Dr Aline Brochure 2012  . Wrist fracture surgery    . Orif hip fracture Right 07/19/2015    Procedure: OPEN REDUCTION INTERNAL FIXATION HIP;  Surgeon: Sanjuana Kava, MD;  Location: [facility];  Service: Orthopedics;  Laterality: Right;   Current Outpatient Prescriptions on File Prior to Visit  Medication Sig Dispense Refill  . acetaminophen (TYLENOL) 325 MG tablet Take 2 tablets (650 mg total) by mouth every 6 (six) hours as needed for fever (prn temp greater than 101 F).    . Cyanocobalamin (VITAMIN B12 PO) Take 1 tablet by mouth daily.     .  iron 325 mg daily      .       . levothyroxine (SYNTHROID, LEVOTHROID) 88 MCG tablet Take 88 mcg by mouth daily.      Marland Kitchen loratadine (CLARITIN) 10 MG tablet Take 10 mg by mouth daily.      Marland Kitchen LORazepam (ATIVAN) 1 MG tablet Take 1 tablet (1 mg total) by mouth every 8 (eight) hours. 30 tablet 0  . meclizine (  ANTIVERT) 32 MG tablet Take 32 mg by mouth 3 (three) times daily as needed.      . Polyethylene Glycol 3350 (MIRALAX PO) Take 17 g by mouth daily as needed (constipation).     . Pyridoxine HCl (VITAMIN B6 PO) Take 1 tablet by mouth daily.     .         Social; as noted the patient lives at home and [place] on her own. She has a walker that she does not use she does not have a history of falling. Reasonably independent in ADLs. Does not get out of the home much. Family takes care of IADLs.  Family History  Problem Relation Age of Onset  . Heart disease       Review of systems; Gen; no  complaints of fever or chills HEENT: uses glasses Respiratory; no shortness of breath, no  cough, Cardiac; no exertional chest pain, no palpitations GI; no abnormal pain, no change in bowel habits GU; no dysuria, no voiding difficulties Musculoskeletal; does not complain of joint pain currently Endocrine; patient is on replacement. No issues reported. TSH in the hospital was 0.861 Neurologic; no focal complaints no weakness no numbness Mental status; lethargy appears to have resolved status post reduction of her Ativan  Physical examination temperature 97.9 pulse 72 respirations 18 blood pressure 124/64 Gen. patient is awake alert cognizant and comfortably in her chair. HEENT oral exam is normal Respiratory clear entry bilaterally Cardiac heart sounds are normal 2/6 systolic ejection murmur no gallops. Patient appears to have a partially 1+ edema on the right leg however this has improved since her hospitalization --pedal pulses are intact Abdomen  Soft nontender with positive bowel sounds  Musculoskeletal;   Ambulatory with her walker but still has some weakness Neurologic is grossly intact her speech is clear no lateralizing findings.  Psych she is alert and oriented pleasant and appropriate  Labs as noted above  Impression/plan #1 right hip fracture status post operative repair, ORIF.  This appears to be stable she worked with therapy will need continued PT and OT at home for #2 lethargy probably secondary to Ativan This is been reduced to 1 mg at at bedtime #3 anemia she was transfused 1 unit in the hospital-hemoglobin recently 10.2 this appears to be rising she is on iron  It looks as though she has chronic normochromic normocytic anemia. #4 leukocytosis.  Her white count initially was 13.7 this has normalized at 7.6 on most recent lab   #5 chronic renal failure stage III. Appears  her creatinine 1.6 relatively baseline on the higher end   #6- Hypertension-again her deal been hydrochlorothiazide has been discontinued nonetheless as noted above her blood  pressures appear to be stable.  #7 hypothyroidism-she continues on Synthroid-TSH was 0.87 on lab done in August--this will warrant follow up by primary care provider  Patient again will be going home with her spouse she will need continued PT and OT for strengthening with history of right hip fracture repair  [id number] note greater than 30 minutes spent on this discharge summary-greater than 50% of time spent coordinating plan of care

## 2015-10-02 ENCOUNTER — Encounter: Payer: Self-pay | Admitting: Internal Medicine

## 2015-10-02 NOTE — Progress Notes (Signed)
This encounter was created in error - please disregard.

## 2015-12-07 ENCOUNTER — Emergency Department (HOSPITAL_COMMUNITY): Payer: Medicare Other

## 2015-12-07 ENCOUNTER — Encounter (HOSPITAL_COMMUNITY): Payer: Self-pay | Admitting: Emergency Medicine

## 2015-12-07 ENCOUNTER — Inpatient Hospital Stay (HOSPITAL_COMMUNITY)
Admission: EM | Admit: 2015-12-07 | Discharge: 2015-12-09 | DRG: 682 | Disposition: A | Discharge status: Home Health | Payer: Medicare Other | Attending: Internal Medicine | Admitting: Internal Medicine

## 2015-12-07 DIAGNOSIS — G9340 Encephalopathy, unspecified: Secondary | ICD-10-CM

## 2015-12-07 DIAGNOSIS — N289 Disorder of kidney and ureter, unspecified: Secondary | ICD-10-CM

## 2015-12-07 DIAGNOSIS — M1990 Unspecified osteoarthritis, unspecified site: Secondary | ICD-10-CM | POA: Diagnosis present

## 2015-12-07 DIAGNOSIS — R4182 Altered mental status, unspecified: Secondary | ICD-10-CM | POA: Diagnosis not present

## 2015-12-07 DIAGNOSIS — I10 Essential (primary) hypertension: Secondary | ICD-10-CM | POA: Diagnosis present

## 2015-12-07 DIAGNOSIS — N184 Chronic kidney disease, stage 4 (severe): Secondary | ICD-10-CM | POA: Diagnosis present

## 2015-12-07 DIAGNOSIS — J019 Acute sinusitis, unspecified: Secondary | ICD-10-CM | POA: Diagnosis present

## 2015-12-07 DIAGNOSIS — Z66 Do not resuscitate: Secondary | ICD-10-CM | POA: Diagnosis present

## 2015-12-07 DIAGNOSIS — E039 Hypothyroidism, unspecified: Secondary | ICD-10-CM | POA: Diagnosis present

## 2015-12-07 DIAGNOSIS — E038 Other specified hypothyroidism: Secondary | ICD-10-CM

## 2015-12-07 DIAGNOSIS — I129 Hypertensive chronic kidney disease with stage 1 through stage 4 chronic kidney disease, or unspecified chronic kidney disease: Secondary | ICD-10-CM | POA: Diagnosis present

## 2015-12-07 DIAGNOSIS — T424X5A Adverse effect of benzodiazepines, initial encounter: Secondary | ICD-10-CM | POA: Diagnosis present

## 2015-12-07 DIAGNOSIS — G92 Toxic encephalopathy: Secondary | ICD-10-CM | POA: Diagnosis present

## 2015-12-07 DIAGNOSIS — N179 Acute kidney failure, unspecified: Secondary | ICD-10-CM | POA: Diagnosis present

## 2015-12-07 DIAGNOSIS — T368X5A Adverse effect of other systemic antibiotics, initial encounter: Secondary | ICD-10-CM | POA: Diagnosis present

## 2015-12-07 DIAGNOSIS — E860 Dehydration: Secondary | ICD-10-CM | POA: Diagnosis present

## 2015-12-07 DIAGNOSIS — J0140 Acute pansinusitis, unspecified: Secondary | ICD-10-CM

## 2015-12-07 LAB — COMPREHENSIVE METABOLIC PANEL
ALT: 17 U/L (ref 14–54)
AST: 19 U/L (ref 15–41)
Albumin: 3.4 g/dL — ABNORMAL LOW (ref 3.5–5.0)
Alkaline Phosphatase: 91 U/L (ref 38–126)
Anion gap: 8 (ref 5–15)
BUN: 63 mg/dL — AB (ref 6–20)
CHLORIDE: 100 mmol/L — AB (ref 101–111)
CO2: 27 mmol/L (ref 22–32)
CREATININE: 2.47 mg/dL — AB (ref 0.44–1.00)
Calcium: 9.4 mg/dL (ref 8.9–10.3)
GFR calc Af Amer: 18 mL/min — ABNORMAL LOW (ref >60)
GFR, EST NON AFRICAN AMERICAN: 15 mL/min — AB (ref >60)
GLUCOSE: 103 mg/dL — AB (ref 65–99)
Potassium: 4.9 mmol/L (ref 3.5–5.1)
Sodium: 135 mmol/L (ref 135–145)
Total Bilirubin: 0.5 mg/dL (ref 0.3–1.2)
Total Protein: 7.1 g/dL (ref 6.5–8.1)

## 2015-12-07 LAB — CBC WITH DIFFERENTIAL/PLATELET
BASOS ABS: 0 10*3/uL (ref 0.0–0.1)
Basophils Relative: 0 %
EOS PCT: 1 %
Eosinophils Absolute: 0.1 10*3/uL (ref 0.0–0.7)
HCT: 30.9 % — ABNORMAL LOW (ref 36.0–46.0)
Hemoglobin: 10.3 g/dL — ABNORMAL LOW (ref 12.0–15.0)
LYMPHS ABS: 1.9 10*3/uL (ref 0.7–4.0)
LYMPHS PCT: 17 %
MCH: 32 pg (ref 26.0–34.0)
MCHC: 33.3 g/dL (ref 30.0–36.0)
MCV: 96 fL (ref 78.0–100.0)
Monocytes Absolute: 0.9 10*3/uL (ref 0.1–1.0)
Monocytes Relative: 8 %
Neutro Abs: 8.2 10*3/uL — ABNORMAL HIGH (ref 1.7–7.7)
Neutrophils Relative %: 74 %
PLATELETS: 335 10*3/uL (ref 150–400)
RBC: 3.22 MIL/uL — AB (ref 3.87–5.11)
RDW: 13.2 % (ref 11.5–15.5)
WBC: 11.2 10*3/uL — AB (ref 4.0–10.5)

## 2015-12-07 LAB — URINE MICROSCOPIC-ADD ON

## 2015-12-07 LAB — URINALYSIS, ROUTINE W REFLEX MICROSCOPIC
Bilirubin Urine: NEGATIVE
GLUCOSE, UA: NEGATIVE mg/dL
Hgb urine dipstick: NEGATIVE
Ketones, ur: NEGATIVE mg/dL
Nitrite: NEGATIVE
PH: 5.5 (ref 5.0–8.0)
Protein, ur: NEGATIVE mg/dL
SPECIFIC GRAVITY, URINE: 1.01 (ref 1.005–1.030)

## 2015-12-07 LAB — TSH: TSH: 3.123 u[IU]/mL (ref 0.350–4.500)

## 2015-12-07 LAB — TROPONIN I: Troponin I: <0.03 ng/mL (ref <0.031)

## 2015-12-07 LAB — LIPASE, BLOOD: LIPASE: 59 U/L — AB (ref 11–51)

## 2015-12-07 MED ORDER — ENOXAPARIN SODIUM 30 MG/0.3ML ~~LOC~~ SOLN
30.0000 mg | SUBCUTANEOUS | Status: DC
  Administered 2015-12-07 – 2015-12-08 (×2): 30 mg via SUBCUTANEOUS
  Filled 2015-12-07 (×2): qty 0.3

## 2015-12-07 MED ORDER — SODIUM CHLORIDE 0.9 % IV SOLN: INTRAVENOUS | Status: AC

## 2015-12-07 MED ORDER — ONDANSETRON HCL 4 MG/2ML IJ SOLN: 4.0000 mg | Freq: Four times a day (QID) | INTRAMUSCULAR | Status: DC | PRN

## 2015-12-07 MED ORDER — SODIUM CHLORIDE 0.9 % IV BOLUS (SEPSIS)
250.0000 mL | Freq: Once | INTRAVENOUS | Status: AC
  Administered 2015-12-07: 250 mL via INTRAVENOUS

## 2015-12-07 MED ORDER — SODIUM CHLORIDE 0.9 % IV SOLN
INTRAVENOUS | Status: DC
  Administered 2015-12-09: 100 mL/h via INTRAVENOUS

## 2015-12-07 MED ORDER — SODIUM CHLORIDE 0.9 % IV SOLN
INTRAVENOUS | Status: DC
  Administered 2015-12-07: 14:00:00 via INTRAVENOUS

## 2015-12-07 MED ORDER — SODIUM CHLORIDE 0.9 % IV BOLUS (SEPSIS): 250.0000 mL | Freq: Once | INTRAVENOUS | Status: AC

## 2015-12-07 MED ORDER — ACETAMINOPHEN 325 MG PO TABS: 650.0000 mg | ORAL_TABLET | Freq: Four times a day (QID) | ORAL | Status: DC | PRN

## 2015-12-07 MED ORDER — ONDANSETRON HCL 4 MG PO TABS: 4.0000 mg | ORAL_TABLET | Freq: Four times a day (QID) | ORAL | Status: DC | PRN

## 2015-12-07 MED ORDER — LORAZEPAM 1 MG PO TABS: 1.0000 mg | ORAL_TABLET | Freq: Three times a day (TID) | ORAL | Status: DC | PRN

## 2015-12-07 MED ORDER — ACETAMINOPHEN 650 MG RE SUPP: 650.0000 mg | Freq: Four times a day (QID) | RECTAL | Status: DC | PRN

## 2015-12-07 MED ORDER — METOPROLOL TARTRATE 25 MG PO TABS
25.0000 mg | ORAL_TABLET | Freq: Two times a day (BID) | ORAL | Status: DC
  Administered 2015-12-07 – 2015-12-09 (×4): 25 mg via ORAL
  Filled 2015-12-07 (×4): qty 1

## 2015-12-07 MED ORDER — LEVOTHYROXINE SODIUM 88 MCG PO TABS
88.0000 ug | ORAL_TABLET | Freq: Every day | ORAL | Status: DC
  Administered 2015-12-08 – 2015-12-09 (×2): 88 ug via ORAL
  Filled 2015-12-07 (×2): qty 1

## 2015-12-07 NOTE — ED Provider Notes (Signed)
CSN: [id number]     Arrival date & time 12/07/15  1016 History  By signing my name below, I, Terressa Koyanagi, attest that this documentation has been prepared under the direction and in the presence of Fredia Sorrow, MD. Electronically Signed: Terressa Koyanagi, ED Scribe. 12/07/2015. 11:32 AM.   LEVEL 5 CAVEAT: AMS   Chief Complaint  Patient presents with  . Altered Mental Status   The history is provided by a relative. No language interpreter was used.   PCP: Clinton Quant, MD HPI Comments: Amanda Prince is a 82 y.o. female, accompanied by her daughters, with PMHx noted below, who presents to the Emergency Department complaining of AMS onset two days ago. Daughters also complain of intermittent episodes of visual hallucinations particularly at night; trouble sleeping; and intermittent episodes of confusion whereby pt is unable to recognize her daughters. Daughter's note that pt has been battling cold like Sx for the past week and her PCP started her on Levoflaxacin 500mg  x1 and robitussin-- daughters report pt took her first and only dose of Levoflaxacin 500mg  x1 yesterday. Daughters deny vomiting. Pt's O2 in RA is 96%.    Past Medical History  Diagnosis Date  . HTN (hypertension)   . Thyroid disease   . Seasonal allergies   . Vertigo   . Anxiety   . Kidney stone   . Arthritis    Past Surgical History  Procedure Laterality Date  . Kidney stones    . Gallbladder surgery    . Otif right wrist  Dr Aline Brochure 2012  . Wrist fracture surgery    . Orif hip fracture Right 07/19/2015    Procedure: OPEN REDUCTION INTERNAL FIXATION HIP;  Surgeon: Sanjuana Kava, MD;  Location: [facility];  Service: Orthopedics;  Laterality: Right;   Family History  Problem Relation Age of Onset  . Heart disease     Social History  Substance Use Topics  . Smoking status: Never Smoker   . Smokeless tobacco: None  . Alcohol Use: No   OB History    No data available     Review of Systems  Unable to  perform ROS: Mental status change   Allergies  Sulfa antibiotics  Home Medications   Prior to Admission medications   Medication Sig Start Date End Date Taking? Authorizing Provider  Artificial Tear Ointment (ARTIFICIAL TEARS) ointment Place 1 drop into both eyes as needed (Dry eye).   Yes Historical Provider, MD  Cyanocobalamin (VITAMIN B12 PO) Take 1 tablet by mouth daily.    Yes Historical Provider, MD  dextromethorphan-guaiFENesin (TUSSIN DM) 10-100 MG/5ML liquid Take 10 mLs by mouth every 6 (six) hours as needed for cough.   Yes Historical Provider, MD  ibuprofen (ADVIL,MOTRIN) 200 MG tablet Take 200 mg by mouth daily.   Yes Historical Provider, MD  levofloxacin (LEVAQUIN) 500 MG tablet Take 1 tablet by mouth daily. 12/05/15  Yes Historical Provider, MD  levothyroxine (SYNTHROID, LEVOTHROID) 88 MCG tablet Take 88 mcg by mouth daily.     Yes Historical Provider, MD  loratadine (CLARITIN) 10 MG tablet Take 10 mg by mouth daily.     Yes Historical Provider, MD  LORazepam (ATIVAN) 1 MG tablet Take 1 tablet (1 mg total) by mouth every 8 (eight) hours. 07/21/15  Yes Erline Hau, MD  meclizine (ANTIVERT) 32 MG tablet Take 25 mg by mouth 3 (three) times daily as needed.    Yes Historical Provider, MD  Pyridoxine HCl (VITAMIN B6 PO) Take 1 tablet  by mouth daily.    Yes Historical Provider, MD  valsartan-hydrochlorothiazide (DIOVAN-HCT) 80-12.5 MG per tablet Take 0.5 tablets by mouth daily.    Yes Historical Provider, MD  acetaminophen (TYLENOL) 325 MG tablet Take 2 tablets (650 mg total) by mouth every 6 (six) hours as needed for fever (prn temp greater than 101 F). 07/21/15   Estela Y Isaac Bliss, MD   Triage Vitals: BP 138/62 mmHg  Pulse 90  Temp(Src) 97.9 F (36.6 C) (Oral)  Resp 20  Wt 108 lb (48.988 kg)  SpO2 98% Physical Exam  Constitutional: She appears well-developed and well-nourished.  HENT:  Head: Normocephalic.  Mouth/Throat: Mucous membranes are normal.   Eyes: EOM are normal. Pupils are equal, round, and reactive to light. No scleral icterus.  Eyes tracking normal   Neck: Normal range of motion.  Cardiovascular: Normal rate and regular rhythm.   Pulmonary/Chest: Effort normal and breath sounds normal. No respiratory distress.  Abdominal: She exhibits no distension. There is no tenderness.  Musculoskeletal: Normal range of motion. She exhibits no edema.  Neurological: She is alert. She displays normal reflexes. She exhibits normal muscle tone. Coordination normal.  Alert and responds to verbal commands   Nursing note and vitals reviewed.   ED Course  Procedures (including critical care time) DIAGNOSTIC STUDIES: Oxygen Saturation is 98% on ra, nl by my interpretation.    COORDINATION OF CARE: 11:29 AM: Discussed treatment plan which includes AMS workup with pt's daughters at bedside; they verbalize understanding and agrees with treatment plan.  Labs Review Labs Reviewed  CBC WITH DIFFERENTIAL/PLATELET - Abnormal; Notable for the following:    WBC 11.2 (*)    RBC 3.22 (*)    Hemoglobin 10.3 (*)    HCT 30.9 (*)    Neutro Abs 8.2 (*)    All other components within normal limits  COMPREHENSIVE METABOLIC PANEL - Abnormal; Notable for the following:    Chloride 100 (*)    Glucose, Bld 103 (*)    BUN 63 (*)    Creatinine, Ser 2.47 (*)    Albumin 3.4 (*)    GFR calc non Af Amer 15 (*)    GFR calc Af Amer 18 (*)    All other components within normal limits  LIPASE, BLOOD - Abnormal; Notable for the following:    Lipase 59 (*)    All other components within normal limits  URINALYSIS, ROUTINE W REFLEX MICROSCOPIC (NOT AT [facility]) - Abnormal; Notable for the following:    APPearance HAZY (*)    Leukocytes, UA LARGE (*)    All other components within normal limits  URINE MICROSCOPIC-ADD ON - Abnormal; Notable for the following:    Squamous Epithelial / LPF TOO NUMEROUS TO COUNT (*)    Bacteria, UA MANY (*)    All other components  within normal limits  TROPONIN I    Imaging Review Dg Chest 2 View  12/07/2015  CLINICAL DATA:  Altered mental status beginning 2 days ago, intermittent episodes of visual hallucinations especially at night, trouble sleeping, intermittent confusion, cough, history hypertension EXAM: CHEST  2 VIEW COMPARISON:  07/18/2015 FINDINGS: Normal heart size, mediastinal contours and pulmonary vascularity. Atherosclerotic calcification aorta. Scarring LEFT mid lung stable. Subsegmental atelectasis RIGHT base. Underlying emphysematous and bronchitic changes. Remaining lungs clear. No pleural effusion or pneumothorax. IMPRESSION: RIGHT basilar atelectasis and LEFT mid lung scarring. No acute abnormalities. Electronically Signed   By: Lavonia Dana M.D.   On: 12/07/2015 13:15   Ct Head Wo  Contrast  12/07/2015  CLINICAL DATA:  Altered mental status for 2 days. Intermittent visual hallucinations. Intermittent confusion EXAM: CT HEAD WITHOUT CONTRAST TECHNIQUE: Contiguous axial images were obtained from the base of the skull through the vertex without intravenous contrast. COMPARISON:  None. FINDINGS: There is moderate diffuse atrophy. There is no intracranial mass, hemorrhage, extra-axial fluid collection, or midline shift. There is patchy small vessel disease in the centra semiovale bilaterally. Elsewhere gray-white compartments appear within normal limits. No acute infarct evident. The bony calvarium appears intact. The mastoid air cells on the low right are clear. There is opacification of several inferior left-sided mastoid air cells. No intraorbital lesions are identified. There is opacification of multiple ethmoid air cells bilaterally. There is mucosal thickening in both maxillary antra. There is a small air-fluid level in the posterior sphenoid sinus region. There are foci of calcification in each distal vertebral artery. IMPRESSION: Atrophy with patchy periventricular small vessel disease. No intracranial mass,  hemorrhage, or acute appearing infarct. Multifocal paranasal sinus disease present. There is inferior mastoid air cell disease on the left; mastoids on the right are clear. Electronically Signed   By: Lowella Grip III M.D.   On: 12/07/2015 13:27   I have personally reviewed and evaluated these images and lab results as part of my medical decision-making.   EKG Interpretation   Date/Time:  Thursday December 07 2015 10:30:15 EST Ventricular Rate:  89 PR Interval:  168 QRS Duration: 89 QT Interval:  345 QTC Calculation: 420 R Axis:   -29 Text Interpretation:  Sinus rhythm Borderline left axis deviation  Confirmed by Brysten Reister  MD, Yani Coventry ([id number]) on 12/07/2015 10:38:30 AM      MDM   Final diagnoses:  Altered mental status, unspecified altered mental status type  Renal insufficiency  Dehydration  Acute pansinusitis, recurrence not specified    Workup for the altered mental status although patient quite alert at this time. However reveals findings of pansinusitis which consistent with her history of an upper respiratory infection. No evidence of pneumonia. BUN and creatinine are a sniffing we elevated the part of this is probably dehydration as well as renal insufficiency. Patient will require admission for hydration. Did not start antibiotics for the pansinusitis here will discuss with hospitalist regarding initiation of antibiotics.  I personally performed the services described in this documentation, which was scribed in my presence. The recorded information has been reviewed and is accurate.      Fredia Sorrow, MD 12/07/15 [phone]

## 2015-12-07 NOTE — ED Notes (Signed)
Daughter says pt has had a cough for the past 2 weeks.  Pt is being treated with Levoflaxin for last 2 days.  Daughter says pt has not been sleeping the last couple days and has been confused and not able to recognize family members at times.    Daughter says pt has productive cough ( white secretions).

## 2015-12-07 NOTE — ED Notes (Signed)
Patient with confusion for last two nights. Family reports patient with hallucinations of people during the night and having trouble recognizing daughters intermittently. Knows month, but no year, states Birthday correctly.

## 2015-12-07 NOTE — H&P (Signed)
[facility] History and Physical  Amanda Prince [id number] DOB: 10/02/1917 DOA: 12/07/2015  Referring physician: Dr. Rogene Houston, ER PCP: Clinton Quant, MD   Chief Complaint: altered mental status  HPI: Amanda Prince is a 82 y.o. female was brought to the ER by family with altered mental status. Patient is unable to contribute to history due to her mental status and therefore details are obtained from her daughter. Patient lives at home with several her daughter taking turns to stay with her. She ambulates with the assistance of a walker. Her daughter reports that for the past 2 weeks she has had an upper respiratory tract infection. She called her primary care physician and was prescribed a course of levofloxacin. This was started approximately 2 days ago. Around the same time, she became increasingly confused. Daughter denied any fever, nausea, vomiting. She did not have any diarrhea. She had no component of chest pain. Her daughter reports that over the past few days she's been eating and drinking less. She was evaluated in the emergency room and found to have acute on chronic renal failure, evidence of dehydration. She is being admitted for further treatments..   Review of Systems:  Pertinent positives as per HPI, otherwise negative  Past Medical History  Diagnosis Date  . HTN (hypertension)   . Thyroid disease   . Seasonal allergies   . Vertigo   . Anxiety   . Kidney stone   . Arthritis    Past Surgical History  Procedure Laterality Date  . Kidney stones    . Gallbladder surgery    . Otif right wrist  Dr Aline Brochure 2012  . Wrist fracture surgery    . Orif hip fracture Right 07/19/2015    Procedure: OPEN REDUCTION INTERNAL FIXATION HIP;  Surgeon: Sanjuana Kava, MD;  Location: [facility];  Service: Orthopedics;  Laterality: Right;   Social History:  reports that she has never smoked. She does not have any smokeless tobacco history on file. She reports that she does not  drink alcohol or use illicit drugs.  Allergies  Allergen Reactions  . Sulfa Antibiotics     Family History  Problem Relation Age of Onset  . Heart disease      Prior to Admission medications   Medication Sig Start Date End Date Taking? Authorizing Provider  Artificial Tear Ointment (ARTIFICIAL TEARS) ointment Place 1 drop into both eyes as needed (Dry eye).   Yes Historical Provider, MD  Cyanocobalamin (VITAMIN B12 PO) Take 1 tablet by mouth daily.    Yes Historical Provider, MD  dextromethorphan-guaiFENesin (TUSSIN DM) 10-100 MG/5ML liquid Take 10 mLs by mouth every 6 (six) hours as needed for cough.   Yes Historical Provider, MD  levothyroxine (SYNTHROID, LEVOTHROID) 88 MCG tablet Take 88 mcg by mouth daily.     Yes Historical Provider, MD  loratadine (CLARITIN) 10 MG tablet Take 10 mg by mouth daily.     Yes Historical Provider, MD  LORazepam (ATIVAN) 1 MG tablet Take 1 tablet (1 mg total) by mouth every 8 (eight) hours. 07/21/15  Yes Erline Hau, MD  meclizine (ANTIVERT) 32 MG tablet Take 25 mg by mouth 3 (three) times daily as needed.    Yes Historical Provider, MD  Pyridoxine HCl (VITAMIN B6 PO) Take 1 tablet by mouth daily.    Yes Historical Provider, MD  acetaminophen (TYLENOL) 325 MG tablet Take 2 tablets (650 mg total) by mouth every 6 (six) hours as needed for fever (prn temp greater  than 101 F). 07/21/15   Erline Hau, MD   Physical Exam: Filed Vitals:   12/07/15 1330 12/07/15 1605 12/07/15 1700 12/07/15 1710  BP: 149/59 135/55    Pulse: 87 95    Temp:  98.6 F (37 C)    TempSrc:  Oral    Resp: 20 18    Height:   5\' 3"  (1.6 m)   Weight:   48.988 kg (108 lb)   SpO2: 98% 100%  91%    Wt Readings from Last 3 Encounters:  12/07/15 48.988 kg (108 lb)  07/19/15 52.617 kg (116 lb)  08/28/11 51.71 kg (114 lb)    General:  Appears calm and comfortable Eyes: PERRL, normal lids, irises & conjunctiva ENT: grossly normal hearing, lips &  tongue Neck: no LAD, masses or thyromegaly Cardiovascular: RRR, no m/r/g. No LE edema. Telemetry: SR, no arrhythmias  Respiratory: CTA bilaterally, no w/r/r. Normal respiratory effort. Abdomen: soft, ntnd Skin: no rash or induration seen on limited exam Musculoskeletal: grossly normal tone BUE/BLE Psychiatric: confused Neurologic: grossly non-focal.          Labs on Admission:  Basic Metabolic Panel:  Recent Labs Lab 12/07/15 1201  NA 135  K 4.9  CL 100*  CO2 27  GLUCOSE 103*  BUN 63*  CREATININE 2.47*  CALCIUM 9.4   Liver Function Tests:  Recent Labs Lab 12/07/15 1201  AST 19  ALT 17  ALKPHOS 91  BILITOT 0.5  PROT 7.1  ALBUMIN 3.4*    Recent Labs Lab 12/07/15 1201  LIPASE 59*   No results for input(s): AMMONIA in the last 168 hours. CBC:  Recent Labs Lab 12/07/15 1201  WBC 11.2*  NEUTROABS 8.2*  HGB 10.3*  HCT 30.9*  MCV 96.0  PLT 335   Cardiac Enzymes:  Recent Labs Lab 12/07/15 1201  TROPONINI <0.03    BNP (last 3 results) No results for input(s): BNP in the last 8760 hours.  ProBNP (last 3 results) No results for input(s): PROBNP in the last 8760 hours.  CBG: No results for input(s): GLUCAP in the last 168 hours.  Radiological Exams on Admission: Dg Chest 2 View  12/07/2015  CLINICAL DATA:  Altered mental status beginning 2 days ago, intermittent episodes of visual hallucinations especially at night, trouble sleeping, intermittent confusion, cough, history hypertension EXAM: CHEST  2 VIEW COMPARISON:  07/18/2015 FINDINGS: Normal heart size, mediastinal contours and pulmonary vascularity. Atherosclerotic calcification aorta. Scarring LEFT mid lung stable. Subsegmental atelectasis RIGHT base. Underlying emphysematous and bronchitic changes. Remaining lungs clear. No pleural effusion or pneumothorax. IMPRESSION: RIGHT basilar atelectasis and LEFT mid lung scarring. No acute abnormalities. Electronically Signed   By: Lavonia Dana M.D.   On:  12/07/2015 13:15   Ct Head Wo Contrast  12/07/2015  CLINICAL DATA:  Altered mental status for 2 days. Intermittent visual hallucinations. Intermittent confusion EXAM: CT HEAD WITHOUT CONTRAST TECHNIQUE: Contiguous axial images were obtained from the base of the skull through the vertex without intravenous contrast. COMPARISON:  None. FINDINGS: There is moderate diffuse atrophy. There is no intracranial mass, hemorrhage, extra-axial fluid collection, or midline shift. There is patchy small vessel disease in the centra semiovale bilaterally. Elsewhere gray-white compartments appear within normal limits. No acute infarct evident. The bony calvarium appears intact. The mastoid air cells on the low right are clear. There is opacification of several inferior left-sided mastoid air cells. No intraorbital lesions are identified. There is opacification of multiple ethmoid air cells bilaterally. There is mucosal  thickening in both maxillary antra. There is a small air-fluid level in the posterior sphenoid sinus region. There are foci of calcification in each distal vertebral artery. IMPRESSION: Atrophy with patchy periventricular small vessel disease. No intracranial mass, hemorrhage, or acute appearing infarct. Multifocal paranasal sinus disease present. There is inferior mastoid air cell disease on the left; mastoids on the right are clear. Electronically Signed   By: Lowella Grip III M.D.   On: 12/07/2015 13:27    EKG: Independently reviewed. No acute changes  Assessment/Plan Active Problems:   Hypothyroidism   HTN (hypertension)   Dehydration   AKI (acute kidney injury) ([place])   CKD (chronic kidney disease) stage 4, GFR 15-29 ml/min (HCC)   Acute encephalopathy   Sinusitis, acute   1. Acute encephalopathy. I suspect this may be an adverse reaction to levofloxacin in the setting of dehydration and renal failure. She also takes scheduled Ativan which is likely contributing. We will hold further  antibiotics. Continue to monitor. Change Ativan to when necessary. 2. Acute kidney injury on CKD stage 3-4 . start the patient on hydration. Monitor urine output. Recheck labs in the morning. Patient is also on daily ibuprofen which will be discontinued. 3. Acute sinusitis. Patient is otherwise afebrile. Will hold further antibiotics for now. Treat supportively. 4. Hypertension. Discontinue valsartan and hydrochlorothiazide due to renal issues. Start metoprolol for blood pressure management. 5. Hypothyroidism. Check TSH and continue Synthroid.    Code Status: DNR DVT Prophylaxis: lovenox Family Communication: discussed with daughter over the phone Disposition Plan: discharge home once improved  Time spent: 12mins  Zofia Peckinpaugh [facility] Pager [phone]

## 2015-12-08 LAB — CBC
HEMATOCRIT: 29.1 % — AB (ref 36.0–46.0)
HEMOGLOBIN: 9.8 g/dL — AB (ref 12.0–15.0)
MCH: 32.3 pg (ref 26.0–34.0)
MCHC: 33.7 g/dL (ref 30.0–36.0)
MCV: 96 fL (ref 78.0–100.0)
Platelets: 394 10*3/uL (ref 150–400)
RBC: 3.03 MIL/uL — ABNORMAL LOW (ref 3.87–5.11)
RDW: 13 % (ref 11.5–15.5)
WBC: 7.2 10*3/uL (ref 4.0–10.5)

## 2015-12-08 LAB — BASIC METABOLIC PANEL
Anion gap: 7 (ref 5–15)
BUN: 49 mg/dL — AB (ref 6–20)
CHLORIDE: 107 mmol/L (ref 101–111)
CO2: 24 mmol/L (ref 22–32)
Calcium: 9 mg/dL (ref 8.9–10.3)
Creatinine, Ser: 1.96 mg/dL — ABNORMAL HIGH (ref 0.44–1.00)
GFR calc Af Amer: 24 mL/min — ABNORMAL LOW (ref >60)
GFR calc non Af Amer: 20 mL/min — ABNORMAL LOW (ref >60)
GLUCOSE: 152 mg/dL — AB (ref 65–99)
POTASSIUM: 4.4 mmol/L (ref 3.5–5.1)
Sodium: 138 mmol/L (ref 135–145)

## 2015-12-08 MED ORDER — GUAIFENESIN ER 600 MG PO TB12
1200.0000 mg | ORAL_TABLET | Freq: Two times a day (BID) | ORAL | Status: DC
  Administered 2015-12-08 – 2015-12-09 (×2): 1200 mg via ORAL
  Filled 2015-12-08 (×2): qty 2

## 2015-12-08 NOTE — Care Management Important Message (Signed)
Important Message  Patient Details  Name: Amanda Prince MRN: [id number] Date of Birth: 10/02/1917   Medicare Important Message Given:  Yes    Alvie Heidelberg, RN 12/08/2015, 9:25 AM

## 2015-12-08 NOTE — Plan of Care (Signed)
Problem: Acute Rehab PT Goals(only PT should resolve) Goal: Patient Will Transfer Sit To/From Stand Pt will transfer sit to/from-stand with RW at ModI 5x in less than 15 seconds for improved independent mobility/functional strength in home.     Goal: Pt Will Ambulate Pt will ambulate with RW at Supervision using a step-through pattern and equal step length for a distances greater than 275ft to demonstrate the ability to perform safe household distance ambulation at discharge.

## 2015-12-08 NOTE — Progress Notes (Signed)
[facility] PROGRESS NOTE  Amanda Prince [id number] DOB: 10/02/1917 DOA: 12/07/2015 PCP: Clinton Quant, MD  Assessment/Plan: 1. Acute encephalopathy, possibly due to an adverse reaction to levofloxacin in the setting of dehydration and renal failure. She also takes scheduled Ativan which is likely contributing. Continue to hold abx and give Ativan PRN. Improving slowly. Family reports that she has had a slow cognitive decline, suspect underlying element of dementia.   2. Acute kidney injury superimposed on CKD stage 3-4.  Improving with hydration. Continue to monitor urine output. NSAIDs were discontinued.  3.  Acute sinusitis. Patient is afebrile. Will hold further antibiotics for now. Treat supportively. 4. Hypertension. Discontinued valsartan and hydrochlorothiazide due to renal issues. Continue metoprolol for blood pressure management. 5. Hypothyroidism. TSH wnl. Continue Synthroid.   Code Status: DNR DVT Prophylaxis: lovenox Family Communication: No family at bedside. Disposition Plan: Discharge when improved.   Consultants:    Procedures:    Antibiotics:    HPI/Subjective: Feels okay. Denies any CP or SOB. Has a mild cough. Per daughter, she seems to be doing better than yesterday but is still not at her baseline. Daughter reports a hx of increased confusion at night.   Objective: Filed Vitals:   12/07/15 2052 12/08/15 0621  BP: 152/66 154/55  Pulse: 94 79  Temp: 98.2 F (36.8 C) 98.5 F (36.9 C)  Resp: 16 16    Intake/Output Summary (Last 24 hours) at 12/08/15 0800 Last data filed at 12/08/15 [id number]  Gross per 24 hour  Intake    730 ml  Output      0 ml  Net    730 ml   Filed Weights   12/07/15 1034 12/07/15 1700  Weight: 48.988 kg (108 lb) 48.988 kg (108 lb)    Exam:  General: NAD, looks comfortable Cardiovascular: RRR, S1, S2  Respiratory: clear bilaterally, No wheezing, rales or rhonchi Abdomen: soft, non tender, no distention ,  bowel sounds normal Musculoskeletal: No edema b/l    Data Reviewed: Basic Metabolic Panel:  Recent Labs Lab 12/07/15 1201  NA 135  K 4.9  CL 100*  CO2 27  GLUCOSE 103*  BUN 63*  CREATININE 2.47*  CALCIUM 9.4   Liver Function Tests:  Recent Labs Lab 12/07/15 1201  AST 19  ALT 17  ALKPHOS 91  BILITOT 0.5  PROT 7.1  ALBUMIN 3.4*    Recent Labs Lab 12/07/15 1201  LIPASE 59*   CBC:  Recent Labs Lab 12/07/15 1201  WBC 11.2*  NEUTROABS 8.2*  HGB 10.3*  HCT 30.9*  MCV 96.0  PLT 335   Cardiac Enzymes:  Recent Labs Lab 12/07/15 1201  TROPONINI <0.03      Studies: Dg Chest 2 View  12/07/2015  CLINICAL DATA:  Altered mental status beginning 2 days ago, intermittent episodes of visual hallucinations especially at night, trouble sleeping, intermittent confusion, cough, history hypertension EXAM: CHEST  2 VIEW COMPARISON:  07/18/2015 FINDINGS: Normal heart size, mediastinal contours and pulmonary vascularity. Atherosclerotic calcification aorta. Scarring LEFT mid lung stable. Subsegmental atelectasis RIGHT base. Underlying emphysematous and bronchitic changes. Remaining lungs clear. No pleural effusion or pneumothorax. IMPRESSION: RIGHT basilar atelectasis and LEFT mid lung scarring. No acute abnormalities. Electronically Signed   By: Lavonia Dana M.D.   On: 12/07/2015 13:15   Ct Head Wo Contrast  12/07/2015  CLINICAL DATA:  Altered mental status for 2 days. Intermittent visual hallucinations. Intermittent confusion EXAM: CT HEAD WITHOUT CONTRAST TECHNIQUE: Contiguous axial images were obtained from the base  of the skull through the vertex without intravenous contrast. COMPARISON:  None. FINDINGS: There is moderate diffuse atrophy. There is no intracranial mass, hemorrhage, extra-axial fluid collection, or midline shift. There is patchy small vessel disease in the centra semiovale bilaterally. Elsewhere gray-white compartments appear within normal limits. No acute  infarct evident. The bony calvarium appears intact. The mastoid air cells on the low right are clear. There is opacification of several inferior left-sided mastoid air cells. No intraorbital lesions are identified. There is opacification of multiple ethmoid air cells bilaterally. There is mucosal thickening in both maxillary antra. There is a small air-fluid level in the posterior sphenoid sinus region. There are foci of calcification in each distal vertebral artery. IMPRESSION: Atrophy with patchy periventricular small vessel disease. No intracranial mass, hemorrhage, or acute appearing infarct. Multifocal paranasal sinus disease present. There is inferior mastoid air cell disease on the left; mastoids on the right are clear. Electronically Signed   By: Lowella Grip III M.D.   On: 12/07/2015 13:27    Scheduled Meds: . enoxaparin (LOVENOX) injection  30 mg Subcutaneous Q24H  . levothyroxine  88 mcg Oral QAC breakfast  . metoprolol tartrate  25 mg Oral BID   Continuous Infusions: . sodium chloride 100 mL/hr at 12/07/15 1900    Active Problems:   Hypothyroidism   HTN (hypertension)   Dehydration   AKI (acute kidney injury) (HCC)   CKD (chronic kidney disease) stage 4, GFR 15-29 ml/min (HCC)   Acute encephalopathy   Sinusitis, acute    Time spent: 25 minutes   Faigy Stretch. MD [facility] Pager [phone]. If 7PM-7AM, please contact night-coverage at [email or web address], password [facility] 12/08/2015, 8:00 AM  LOS: 1 day      By signing my name below, I, Rosalie Doctor, attest that this documentation has been prepared under the direction and in the presence of [employer]. MD Electronically Signed: Rosalie Doctor, Scribe. 12/08/2015 12:31pm   I, Dr. Kathie Dike, personally performed the services described in this documentaiton. All medical record entries made by the scribe were at my direction and in my presence. I have reviewed the chart and agree that the record  reflects my personal performance and is accurate and complete  Kathie Dike, MD, 12/08/2015 12:51 PM

## 2015-12-08 NOTE — Evaluation (Signed)
Physical Therapy Evaluation Patient Details Name: Amanda Prince MRN: [id number] DOB: 10/02/1917 Today's Date: 12/08/2015   History of Present Illness  82yo white female who comes to [facility] via family after noted AMS. Family reports URI last 2 weeks. Pt found to be dehydrated. PMH: R hip ORIF (Aug '16), HT, St-IV KD, hypothyroidism. This admission imaging shows bilat atelectasis, and negative for acute cerebral infarct. At baseline, pt lives at home with rotating 24/7 assistance from daughters. Pt walks mostly household distances with RW, but will walk longer for doctors visits, etc.   Clinical Impression  Pt is received semirecumbent in bed upon entry, awake, alert, and willing to participate. No acute distress noted, although pat remains mildly lethargic, not having slept much the last 3 nights. Pt is conversational and pleasant but is oriented to person, at this time. Pt was previously admitted after a fall adn R hip Fx (ORIF) in August 2016, and subsequently at [facility] for 10 weeks. Pt strength as screened by MMT is 5/5 throughout with mildly weak grip strength, but is generally weak in upper and lower quarters as noted during functional mobility assessment. Pt is near baseline for mobility, however ambulation is 25-50% more limited, with decreased tolerance to prolonged activity. Patient presenting with impairment of strength, balance, and activity tolerance, limiting ability to perform ADL and mobility tasks at  baseline level of function. Patient will benefit from skilled intervention to address the above impairments and limitations, in order to restore to prior level of function, improve patient safety upon discharge, and to decrease falls risk. Pt has adequate social support for return to home at DC with HHPT services to restore functional indep, safety, and to decrease caregiver burden.       Follow Up Recommendations Home health PT    Equipment Recommendations  None recommended by PT     Recommendations for Other Services       Precautions / Restrictions Precautions Precaution Comments: no score availale Restrictions Weight Bearing Restrictions: No      Mobility  Bed Mobility Overal bed mobility: Modified Independent                Transfers Overall transfer level: Modified independent Equipment used: Rolling walker (2 wheeled)                Ambulation/Gait Ambulation/Gait assistance: Min guard Ambulation Distance (Feet): 60 Feet Assistive device: Rolling walker (2 wheeled)     Gait velocity interpretation: <1.8 ft/sec, indicative of risk for recurrent falls General Gait Details: trunk flexed, poor weight shift, slow, steady, decreased step length bilat.   Stairs            Wheelchair Mobility    Modified Rankin (Stroke Patients Only)       Balance Overall balance assessment: No apparent balance deficits (not formally assessed);History of Falls                                           Pertinent Vitals/Pain      Home Living Family/patient expects to be discharged to:: Private residence Living Arrangements: Alone Available Help at Discharge: Family;Available 24 hours/day Type of Home: House         Home Equipment: Walker - 2 wheels;Cane - single point;Bedside commode      Prior Function Level of Independence: Needs assistance   Gait / Transfers Assistance Needed: supervision for AMB  ADL's / Homemaking Assistance Needed: some set-up for ADLs, Total care for IADL  Comments: usually used a walker for gait, but needs reminders.      Hand Dominance        Extremity/Trunk Assessment   Upper Extremity Assessment: Generalized weakness           Lower Extremity Assessment: Generalized weakness      Cervical / Trunk Assessment: Kyphotic  Communication   Communication: No difficulties  Cognition Arousal/Alertness: Awake/alert Behavior During Therapy: WFL for tasks  assessed/performed Overall Cognitive Status: History of cognitive impairments - at baseline (Family reports cognition to fluctuate, but has been worse as of late. )                      General Comments      Exercises        Assessment/Plan    PT Assessment Patient needs continued PT services  PT Diagnosis Difficulty walking;Altered mental status;Generalized weakness   PT Problem List Decreased strength;Decreased activity tolerance;Decreased mobility;Decreased cognition;Cardiopulmonary status limiting activity  PT Treatment Interventions Gait training;Functional mobility training;Therapeutic activities;Therapeutic exercise;Patient/family education   PT Goals (Current goals can be found in the Care Plan section) Acute Rehab PT Goals Patient Stated Goal: improve strength, and functional mobility.  PT Goal Formulation: With patient Time For Goal Achievement: 12/22/15 Potential to Achieve Goals: Good    Frequency Min 3X/week   Barriers to discharge        Co-evaluation               End of Session Equipment Utilized During Treatment: Gait belt Activity Tolerance: Patient tolerated treatment well;Patient limited by lethargy Patient left: in bed;with nursing/sitter in room;with family/visitor present;with call bell/phone within reach Nurse Communication: Other (comment)         [id number] PT Time Calculation (min) (ACUTE ONLY): 15 min   Charges:   PT Evaluation $PT Eval Low Complexity: 1 Procedure PT Treatments $Therapeutic Activity: 8-22 mins   PT G Codes:        Deleah Tison C 01-06-2016, 1:04 PM  1:09 PM  Etta Grandchild, PT, DPT [place] License # [id number]

## 2015-12-08 NOTE — Care Management (Addendum)
Spoke with patient and daughter who was at the bedside. Patient is alert and daughter stated that the family takes turns staying with the patient. Patient is alert and answers questions appropriately. Patietn stated that se has a can and a walker that she uses in the home.

## 2015-12-08 NOTE — Care Management Note (Signed)
Case Management Note  Patient Details  Name: JULIENNE GUNDY MRN: [id number] Date of Birth: 10/02/1917  Subjective/Objective:         Spoke with family of the patient at the bedside. Patient from home with family support. PT recommends Home with Home Health.  Daughter [name] stated that the patient has a walker and cane at home.          Action/Plan: Home with Home Health.   Expected Discharge Date:                  Expected Discharge Plan:  [place]  In-House Referral:     Discharge planning Services  CM Consult  Post Acute Care Choice:  NA Choice offered to:  Adult Children  DME Arranged:    DME Agency:  [place] Arranged:  PT, RN [facility] Agency:  [place]  Status of Service:  Completed, signed off  Medicare Important Message Given:  Yes Date Medicare IM Given:    Medicare IM give by:    Date Additional Medicare IM Given:    Additional Medicare Important Message give by:     If discussed at [place] of Stay Meetings, dates discussed:    Additional Comments:  Alvie Heidelberg, RN 12/08/2015, 3:50 PM

## 2015-12-08 NOTE — Progress Notes (Signed)
Bed alarm was going off as patient attempts to get out of bed despite repeated attempts to reorient patient and convince her she is in the hospital and that I am not in her home.  Patient has gotten out of bed on multiple attempts and pulled IV access out.  This time patient is getting out of bed and attempting to pull out her IV access again stating she needs to get up and put on her pajamas for bed.  I attempted to convince her to stay in bed and attempted to stop her from pulling her IV access out and she became angry and began to strike at me with her fist which I was able to stop by taking hold of her forearms in an attempt to prevent her from hitting me.  I called for staff assistance and applied mittens to her hands to help prevent her from pulling IV access out.  I called her daughter [name] and explained the situation and made her aware of the behavior of the patient and asked for assistance.  [name] informed me she was coming to the hospital.  Patient calmed down after mittens were applied and she was assisted back up into the bed.  I remained at bedside as there were no sitters available at this time per the [facility] on duty.  Patient's daughter [name] arrived and patient was assisted to the bathroom and assisted back to bed.  The patient complained of pain at her right wrist and I noted a skin tear to her right wrist.  I dressed the skin tear with Vaseline gauze and secured it with telfa and gauze.  I removed the mittens at the patients insistence and covered her IV site with a sock. I left the room with the patient in bed and the daughter at the bedside.  Patient appears to be more at ease with her daughter at the bedside.  The bed alarm is engaged and frequent checks will be done for patient safety.  Nursing staff to continue to monitor.

## 2015-12-09 LAB — BASIC METABOLIC PANEL
ANION GAP: 6 (ref 5–15)
BUN: 35 mg/dL — ABNORMAL HIGH (ref 6–20)
CO2: 22 mmol/L (ref 22–32)
Calcium: 8.8 mg/dL — ABNORMAL LOW (ref 8.9–10.3)
Chloride: 112 mmol/L — ABNORMAL HIGH (ref 101–111)
Creatinine, Ser: 1.54 mg/dL — ABNORMAL HIGH (ref 0.44–1.00)
GFR calc Af Amer: 32 mL/min — ABNORMAL LOW (ref >60)
GFR calc non Af Amer: 27 mL/min — ABNORMAL LOW (ref >60)
Glucose, Bld: 94 mg/dL (ref 65–99)
Potassium: 4.3 mmol/L (ref 3.5–5.1)
Sodium: 140 mmol/L (ref 135–145)

## 2015-12-09 LAB — CBC
HEMATOCRIT: 31.3 % — AB (ref 36.0–46.0)
HEMOGLOBIN: 10.4 g/dL — AB (ref 12.0–15.0)
MCH: 31.9 pg (ref 26.0–34.0)
MCHC: 33.2 g/dL (ref 30.0–36.0)
MCV: 96 fL (ref 78.0–100.0)
Platelets: 388 10*3/uL (ref 150–400)
RBC: 3.26 MIL/uL — AB (ref 3.87–5.11)
RDW: 12.9 % (ref 11.5–15.5)
WBC: 7.1 10*3/uL (ref 4.0–10.5)

## 2015-12-09 MED ORDER — METOPROLOL TARTRATE 25 MG PO TABS: 25.0000 mg | ORAL_TABLET | Freq: Two times a day (BID) | ORAL | Status: DC

## 2015-12-09 NOTE — Discharge Summary (Signed)
Physician Discharge Summary  Amanda Prince [id number] DOB: 10/02/1917 DOA: 12/07/2015  PCP: Clinton Quant, MD  Admit date: 12/07/2015 Discharge date: 12/09/2015  Time spent: 35 minutes  Recommendations for Outpatient Follow-up:  1. Discharge home with [facility] 2. Follow up with PCP within 1-2 weeks.  Discharge Diagnoses:  Active Problems:   Hypothyroidism   HTN (hypertension)   Dehydration   AKI (acute kidney injury) ([place])   CKD (chronic kidney disease) stage 4, GFR 15-29 ml/min (H[facility])   Acute encephalopathy   Sinusitis, acute   Discharge Condition: Improved   Diet recommendation: Heart healthy   Filed Weights   12/07/15 1034 12/07/15 1700  Weight: 48.988 kg (108 lb) 48.988 kg (108 lb)    History of present illness:  82 y.o. female was brought to the ER by family with altered mental status. Patient was unable to contribute to history due to her mental status and therefore details were obtained from her daughter. Patient lives at home with several her daughter taking turns to stay with her. She ambulates with the assistance of a walker. Her daughter reports that for the past 2 weeks she has had an upper respiratory tract infection. She called her primary care physician and was prescribed a course of levofloxacin. This was started approximately 2 days ago. Around the same time, she became increasingly confused. Daughter denied any fever, nausea, vomiting. She did not have any diarrhea. She had no component of chest pain. Her daughter reports that over the past few days she's been eating and drinking less. She was evaluated in the emergency room and found to have acute on chronic renal failure, evidence of dehydration. She was being admitted for further treatments.  Hospital Course:  Acute encephalopathy appears to be resolved. I suspect this may have beeen an adverse reaction to levofloxacin in the setting of dehydration and renal failure. She also takes scheduled Ativan which is  likely contributing. Antibiotics discontinued on admission. She clinically improved with IV hydration and withholding medications. Upon discharge counseled on the importance of hydration. She appears to be back to baseline.  1. Acute kidney injury on CKD stage 3-4. Slowly improved with IV hydration. Patient was also on daily ibuprofen prior to admission which was discontinued. 2. Acute sinusitis. Patient is otherwise afebrile. Treat supportively due to abx being discontinued.  3. Hypertension. Discontinued valsartan and hydrochlorothiazide due to renal issues. Metoprolol started for blood pressure management. Further adjustment to medications to be done as an outpatient.  4. Hypothyroidism. Check TSH WNL. Continue Synthroid.  Procedures:  None  Consultations:  None  Discharge Exam: Filed Vitals:   12/08/15 2112 12/09/15 0618  BP: 136/60 160/70  Pulse: 79 79  Temp: 99 F (37.2 C) 99 F (37.2 C)  Resp: 16 16    General: NAD. Sitting on the edge of the bed eating.  Cardiovascular: RRR, S1, S2  Respiratory: clear bilaterally, No wheezing, rales or rhonchi Abdomen: soft, non tender, no distention , bowel sounds normal Musculoskeletal: No edema b/l  Discharge Instructions    Current Discharge Medication List    CONTINUE these medications which have NOT CHANGED   Details  Artificial Tear Ointment (ARTIFICIAL TEARS) ointment Place 1 drop into both eyes as needed (Dry eye).    Cyanocobalamin (VITAMIN B12 PO) Take 1 tablet by mouth daily.     dextromethorphan-guaiFENesin (TUSSIN DM) 10-100 MG/5ML liquid Take 10 mLs by mouth every 6 (six) hours as needed for cough.    levothyroxine (SYNTHROID, LEVOTHROID) 88 MCG  tablet Take 88 mcg by mouth daily.      loratadine (CLARITIN) 10 MG tablet Take 10 mg by mouth daily.      LORazepam (ATIVAN) 1 MG tablet Take 1 tablet (1 mg total) by mouth every 8 (eight) hours. Qty: 30 tablet, Refills: 0    meclizine (ANTIVERT) 32 MG tablet Take  25 mg by mouth 3 (three) times daily as needed.     Pyridoxine HCl (VITAMIN B6 PO) Take 1 tablet by mouth daily.     acetaminophen (TYLENOL) 325 MG tablet Take 2 tablets (650 mg total) by mouth every 6 (six) hours as needed for fever (prn temp greater than 101 F).       Allergies  Allergen Reactions  . Sulfa Antibiotics       The results of significant diagnostics from this hospitalization (including imaging, microbiology, ancillary and laboratory) are listed below for reference.    Significant Diagnostic Studies: Dg Chest 2 View  12/07/2015  CLINICAL DATA:  Altered mental status beginning 2 days ago, intermittent episodes of visual hallucinations especially at night, trouble sleeping, intermittent confusion, cough, history hypertension EXAM: CHEST  2 VIEW COMPARISON:  07/18/2015 FINDINGS: Normal heart size, mediastinal contours and pulmonary vascularity. Atherosclerotic calcification aorta. Scarring LEFT mid lung stable. Subsegmental atelectasis RIGHT base. Underlying emphysematous and bronchitic changes. Remaining lungs clear. No pleural effusion or pneumothorax. IMPRESSION: RIGHT basilar atelectasis and LEFT mid lung scarring. No acute abnormalities. Electronically Signed   By: Lavonia Dana M.D.   On: 12/07/2015 13:15   Ct Head Wo Contrast  12/07/2015  CLINICAL DATA:  Altered mental status for 2 days. Intermittent visual hallucinations. Intermittent confusion EXAM: CT HEAD WITHOUT CONTRAST TECHNIQUE: Contiguous axial images were obtained from the base of the skull through the vertex without intravenous contrast. COMPARISON:  None. FINDINGS: There is moderate diffuse atrophy. There is no intracranial mass, hemorrhage, extra-axial fluid collection, or midline shift. There is patchy small vessel disease in the centra semiovale bilaterally. Elsewhere gray-white compartments appear within normal limits. No acute infarct evident. The bony calvarium appears intact. The mastoid air cells on the low  right are clear. There is opacification of several inferior left-sided mastoid air cells. No intraorbital lesions are identified. There is opacification of multiple ethmoid air cells bilaterally. There is mucosal thickening in both maxillary antra. There is a small air-fluid level in the posterior sphenoid sinus region. There are foci of calcification in each distal vertebral artery. IMPRESSION: Atrophy with patchy periventricular small vessel disease. No intracranial mass, hemorrhage, or acute appearing infarct. Multifocal paranasal sinus disease present. There is inferior mastoid air cell disease on the left; mastoids on the right are clear. Electronically Signed   By: Lowella Grip III M.D.   On: 12/07/2015 13:27    Microbiology: No results found for this or any previous visit (from the past 240 hour(s)).   Labs: Basic Metabolic Panel:  Recent Labs Lab 12/07/15 1201 12/08/15 0827 12/09/15 0600  NA 135 138 140  K 4.9 4.4 4.3  CL 100* 107 112*  CO2 27 24 22   GLUCOSE 103* 152* 94  BUN 63* 49* 35*  CREATININE 2.47* 1.96* 1.54*  CALCIUM 9.4 9.0 8.8*   Liver Function Tests:  Recent Labs Lab 12/07/15 1201  AST 19  ALT 17  ALKPHOS 91  BILITOT 0.5  PROT 7.1  ALBUMIN 3.4*    Recent Labs Lab 12/07/15 1201  LIPASE 59*   CBC:  Recent Labs Lab 12/07/15 1201 12/08/15 0827 12/09/15 0600  WBC 11.2* 7.2 7.1  NEUTROABS 8.2*  --   --   HGB 10.3* 9.8* 10.4*  HCT 30.9* 29.1* 31.3*  MCV 96.0 96.0 96.0  PLT 335 394 388   Cardiac Enzymes:  Recent Labs Lab 12/07/15 1201  TROPONINI <0.03    Signed:  Gerrit Heck MD.  [facility] 12/09/2015, 8:07 AM     By signing my name below, I, Rennis Harding, attest that this documentation has been prepared under the direction and in the presence of Kathie Dike, MD. Electronically signed: Rennis Harding, Scribe. 12/09/2015 12:00pm  I, Dr. Kathie Dike, personally performed the services described in this  documentaiton. All medical record entries made by the scribe were at my direction and in my presence. I have reviewed the chart and agree that the record reflects my personal performance and is accurate and complete  Kathie Dike, MD, 12/09/2015 12:23 PM

## 2015-12-09 NOTE — Progress Notes (Signed)
Patient discharged with instructions, prescription, and care notes.  Verbalized understanding via teach back.  IV was removed and the site was WNL. Patient voiced no further complaints or concerns at the time of discharge.  Appointments scheduled per instructions.  Patient left the floor via w/c with staff and family in stable condition. 

## 2015-12-20 ENCOUNTER — Other Ambulatory Visit (HOSPITAL_COMMUNITY)
Admission: RE | Admit: 2015-12-20 | Discharge: 2015-12-20 | Disposition: A | Discharge status: Home / Self Care | Payer: Medicare Other | Source: Other Acute Inpatient Hospital | Attending: Internal Medicine | Admitting: Internal Medicine

## 2015-12-20 DIAGNOSIS — N390 Urinary tract infection, site not specified: Secondary | ICD-10-CM | POA: Insufficient documentation

## 2015-12-20 LAB — BASIC METABOLIC PANEL
Anion gap: 8 (ref 5–15)
BUN: 35 mg/dL — ABNORMAL HIGH (ref 6–20)
CALCIUM: 9.1 mg/dL (ref 8.9–10.3)
CHLORIDE: 97 mmol/L — AB (ref 101–111)
CO2: 29 mmol/L (ref 22–32)
CREATININE: 1.65 mg/dL — AB (ref 0.44–1.00)
GFR calc Af Amer: 29 mL/min — ABNORMAL LOW (ref >60)
GFR calc non Af Amer: 25 mL/min — ABNORMAL LOW (ref >60)
GLUCOSE: 124 mg/dL — AB (ref 65–99)
Potassium: 4.7 mmol/L (ref 3.5–5.1)
Sodium: 134 mmol/L — ABNORMAL LOW (ref 135–145)

## 2015-12-20 LAB — CBC
HCT: 32.5 % — ABNORMAL LOW (ref 36.0–46.0)
HEMOGLOBIN: 10.7 g/dL — AB (ref 12.0–15.0)
MCH: 32.2 pg (ref 26.0–34.0)
MCHC: 32.9 g/dL (ref 30.0–36.0)
MCV: 97.9 fL (ref 78.0–100.0)
PLATELETS: 428 10*3/uL — AB (ref 150–400)
RBC: 3.32 MIL/uL — AB (ref 3.87–5.11)
RDW: 13.5 % (ref 11.5–15.5)
WBC: 7 10*3/uL (ref 4.0–10.5)

## 2016-01-31 ENCOUNTER — Ambulatory Visit (INDEPENDENT_AMBULATORY_CARE_PROVIDER_SITE_OTHER): Payer: Medicare Other

## 2016-01-31 ENCOUNTER — Ambulatory Visit (INDEPENDENT_AMBULATORY_CARE_PROVIDER_SITE_OTHER): Payer: Medicare Other | Admitting: Orthopaedic Surgery

## 2016-01-31 VITALS — BP 199/81 | HR 72 | Temp 97.5°F | Ht 63.0 in | Wt 115.0 lb

## 2016-01-31 DIAGNOSIS — S72141D Displaced intertrochanteric fracture of right femur, subsequent encounter for closed fracture with routine healing: Secondary | ICD-10-CM

## 2016-01-31 NOTE — Progress Notes (Signed)
Patient PR:[id number] Amanda Prince, female DOB:10/02/1917, 82 y.o. [id number]  Chief Complaint  Patient presents with  . Follow-up    Right hip fracture "my leg hurts"    HPI  Amanda Prince is a 82 y.o. female who had an intertrochanteric fracture of the right hip in August.  She is post surgical repair.  She is doing well.  She has some pain in cold weather and some pain with long activity.  She is using a walker and doing well.  She has new trauma.  She says all is OK.  HPI  Body mass index is 20.38 kg/(m^2).  Review of Systems  Constitutional:       Patient does not have Diabetes Mellitus. Patient has hypertension. Patient does not have COPD or shortness of breath. Patient does not have BMI > 35. Patient does not have current smoking history.  Musculoskeletal: Positive for gait problem.    Past Medical History  Diagnosis Date  . HTN (hypertension)   . Thyroid disease   . Seasonal allergies   . Vertigo   . Anxiety   . Kidney stone   . Arthritis     Past Surgical History  Procedure Laterality Date  . Kidney stones    . Gallbladder surgery    . Otif right wrist  Dr Aline Brochure 2012  . Wrist fracture surgery    . Orif hip fracture Right 07/19/2015    Procedure: OPEN REDUCTION INTERNAL FIXATION HIP;  Surgeon: Sanjuana Kava, MD;  Location: [facility];  Service: Orthopedics;  Laterality: Right;    Family History  Problem Relation Age of Onset  . Heart disease      Social History Social History  Substance Use Topics  . Smoking status: Never Smoker   . Smokeless tobacco: Not on file  . Alcohol Use: No    Allergies  Allergen Reactions  . Sulfa Antibiotics     Current Outpatient Prescriptions  Medication Sig Dispense Refill  . acetaminophen (TYLENOL) 325 MG tablet Take 2 tablets (650 mg total) by mouth every 6 (six) hours as needed for fever (prn temp greater than 101 F).    . Artificial Tear Ointment (ARTIFICIAL TEARS) ointment Place 1 drop into both eyes as needed (Dry  eye).    . Cyanocobalamin (VITAMIN B12 PO) Take 1 tablet by mouth daily.     Marland Kitchen dextromethorphan-guaiFENesin (TUSSIN DM) 10-100 MG/5ML liquid Take 10 mLs by mouth every 6 (six) hours as needed for cough.    . donepezil (ARICEPT) 5 MG tablet     . furosemide (LASIX) 20 MG tablet     . levothyroxine (SYNTHROID, LEVOTHROID) 88 MCG tablet Take 88 mcg by mouth daily.      Marland Kitchen loratadine (CLARITIN) 10 MG tablet Take 10 mg by mouth daily.      Marland Kitchen LORazepam (ATIVAN) 1 MG tablet Take 1 tablet (1 mg total) by mouth every 8 (eight) hours. 30 tablet 0  . meclizine (ANTIVERT) 32 MG tablet Take 25 mg by mouth 3 (three) times daily as needed.     . metoprolol tartrate (LOPRESSOR) 25 MG tablet Take 1 tablet (25 mg total) by mouth 2 (two) times daily. 60 tablet 1  . Pyridoxine HCl (VITAMIN B6 PO) Take 1 tablet by mouth daily.     . valsartan-hydrochlorothiazide (DIOVAN-HCT) 80-12.5 MG tablet     . zolpidem (AMBIEN) 5 MG tablet Take 5 mg by mouth at bedtime as needed for sleep.     No current facility-administered medications for  this visit.     Physical Exam  Blood pressure 199/81, pulse 72, temperature 97.5 F (36.4 C), height 5\' 3"  (1.6 m), weight 115 lb (52.164 kg).  Constitutional: overall normal hygiene, normal nutrition, well developed, normal grooming, normal body habitus. Assistive device:walker  Musculoskeletal: gait and station Limp very slight right, muscle tone and strength are normal, no tremors or atrophy is present.  .  Neurological: coordination overall normal.  Deep tendon reflex/nerve stretch intact.  Sensation normal.  Cranial nerves II-XII intact.   Skin:   Well healed scar right hip, otherwise overall no scars, lesions, ulcers or rashes. No psoriasis.  Psychiatric: Alert and oriented x 3.  Recent memory intact, remote memory unclear.  Normal mood and affect. Well groomed.  Good eye contact.  Cardiovascular: overall no swelling, no varicosities, no edema bilaterally, normal  temperatures of the legs and arms, no clubbing, cyanosis and good capillary refill.  Lymphatic: palpation is normal.   Extremities:Right hip with well healed surgical scar.  No pain. Inspection well healed scar.  Walks well with walker. Strength and tone normal of the right lower leg Range of motion full of the hip and knee and ankle right  Additional services performed: x-rays were done of the right hip  Her blood pressure is well maintained and monitored.  She has no distal edema.  She and I talked about the use of the walker.  She prefers to use it.  I will not ask her to use a cane.  She needs to be a little more active however and I have encouraged this.  The patient has been educated about the nature of the problem(s) and counseled on treatment options.  The patient appeared to understand what I have discussed and is in agreement with it.  PLAN Call if any problems.  Precautions discussed.  Continue current medications.   Return to clinic 4 months

## 2016-05-29 ENCOUNTER — Ambulatory Visit (INDEPENDENT_AMBULATORY_CARE_PROVIDER_SITE_OTHER): Payer: Medicare Other

## 2016-05-29 ENCOUNTER — Ambulatory Visit (INDEPENDENT_AMBULATORY_CARE_PROVIDER_SITE_OTHER): Payer: Medicare Other | Admitting: Orthopaedic Surgery

## 2016-05-29 ENCOUNTER — Encounter: Payer: Self-pay | Admitting: Orthopaedic Surgery

## 2016-05-29 VITALS — BP 157/81 | HR 73 | Temp 96.8°F | Resp 18 | Ht 60.0 in | Wt 125.0 lb

## 2016-05-29 DIAGNOSIS — S72141D Displaced intertrochanteric fracture of right femur, subsequent encounter for closed fracture with routine healing: Secondary | ICD-10-CM

## 2016-05-29 NOTE — Progress Notes (Signed)
Patient PR:[id number] Amanda Prince, female DOB:10/02/1917, 82 y.o. [id number]  Chief Complaint  Patient presents with  . Follow-up    4 month recheck on righ thip fracture with xray.    HPI  Amanda Prince is a 82 y.o. female who had fracture of the right hip in the past.  She is doing well.  She is not walking much.  She has a walker.  She has no new trauma.  I have encouraged her to walk as much as she can. She has no paresthesias.    HPI  Body mass index is 24.41 kg/(m^2).  ROS  Review of Systems  Constitutional:       Patient does not have Diabetes Mellitus. Patient has hypertension. Patient does not have COPD or shortness of breath. Patient does not have BMI > 35. Patient does not have current smoking history.  Musculoskeletal: Positive for gait problem.    Past Medical History  Diagnosis Date  . HTN (hypertension)   . Thyroid disease   . Seasonal allergies   . Vertigo   . Anxiety   . Kidney stone   . Arthritis     Past Surgical History  Procedure Laterality Date  . Kidney stones    . Gallbladder surgery    . Otif right wrist  Dr Aline Brochure 2012  . Wrist fracture surgery    . Orif hip fracture Right 07/19/2015    Procedure: OPEN REDUCTION INTERNAL FIXATION HIP;  Surgeon: Sanjuana Kava, MD;  Location: [facility];  Service: Orthopedics;  Laterality: Right;    Family History  Problem Relation Age of Onset  . Heart disease      Social History Social History  Substance Use Topics  . Smoking status: Never Smoker   . Smokeless tobacco: None  . Alcohol Use: No    Allergies  Allergen Reactions  . Sulfa Antibiotics     Current Outpatient Prescriptions  Medication Sig Dispense Refill  . acetaminophen (TYLENOL) 325 MG tablet Take 2 tablets (650 mg total) by mouth every 6 (six) hours as needed for fever (prn temp greater than 101 F).    . Artificial Tear Ointment (ARTIFICIAL TEARS) ointment Place 1 drop into both eyes as needed (Dry eye).    . Cyanocobalamin (VITAMIN  B12 PO) Take 1 tablet by mouth daily.     Marland Kitchen dextromethorphan-guaiFENesin (TUSSIN DM) 10-100 MG/5ML liquid Take 10 mLs by mouth every 6 (six) hours as needed for cough.    . donepezil (ARICEPT) 5 MG tablet     . furosemide (LASIX) 20 MG tablet     . levothyroxine (SYNTHROID, LEVOTHROID) 88 MCG tablet Take 88 mcg by mouth daily.      Marland Kitchen loratadine (CLARITIN) 10 MG tablet Take 10 mg by mouth daily.      Marland Kitchen LORazepam (ATIVAN) 1 MG tablet Take 1 tablet (1 mg total) by mouth every 8 (eight) hours. 30 tablet 0  . meclizine (ANTIVERT) 32 MG tablet Take 25 mg by mouth 3 (three) times daily as needed.     . metoprolol tartrate (LOPRESSOR) 25 MG tablet Take 1 tablet (25 mg total) by mouth 2 (two) times daily. 60 tablet 1  . Pyridoxine HCl (VITAMIN B6 PO) Take 1 tablet by mouth daily.     . valsartan-hydrochlorothiazide (DIOVAN-HCT) 80-12.5 MG tablet     . zolpidem (AMBIEN) 5 MG tablet Take 5 mg by mouth at bedtime as needed for sleep.     No current facility-administered medications for this visit.  Physical Exam  Blood pressure 157/81, pulse 73, temperature 96.8 F (36 C), resp. rate 18, height 5' (1.524 m), weight 125 lb (56.7 kg).  Constitutional: overall normal hygiene, normal nutrition, well developed, normal grooming, normal body habitus. Assistive device:walker  Musculoskeletal: gait and station Limp right, muscle tone and strength are normal, no tremors or atrophy is present.  .  Neurological: coordination overall normal.  Deep tendon reflex/nerve stretch intact.  Sensation normal.  Cranial nerves II-XII intact.   Skin:   Well healed right hip scar, otherwise overall no scars, lesions, ulcers or rashes. No psoriasis.  Psychiatric: Alert and oriented x 3.  Recent memory intact, remote memory unclear.  Normal mood and affect. Well groomed.  Good eye contact.  Cardiovascular: overall no swelling, no varicosities, no edema bilaterally, normal temperatures of the legs and arms, no clubbing,  cyanosis and good capillary refill.  Lymphatic: palpation is normal.  She has full ROM of both hips and no leg length problem.  NV is intact.  The patient has been educated about the nature of the problem(s) and counseled on treatment options.  The patient appeared to understand what I have discussed and is in agreement with it.  Encounter Diagnosis  Name Primary?  . Intertrochanteric fracture of right hip, closed, with routine healing, subsequent encounter Yes    PLAN Call if any problems.  Precautions discussed.  Continue current medications.   Return to clinic PRN   Electronically Mercer, MD 7/5/201710:07 AM

## 2016-12-31 ENCOUNTER — Encounter (HOSPITAL_COMMUNITY): Payer: Self-pay

## 2016-12-31 ENCOUNTER — Emergency Department (HOSPITAL_COMMUNITY)
Admission: EM | Admit: 2016-12-31 | Discharge: 2017-01-01 | Disposition: A | Discharge status: Home / Self Care | Payer: Medicare Other | Attending: Emergency Medicine | Admitting: Emergency Medicine

## 2016-12-31 DIAGNOSIS — Z79899 Other long term (current) drug therapy: Secondary | ICD-10-CM | POA: Insufficient documentation

## 2016-12-31 DIAGNOSIS — Z85828 Personal history of other malignant neoplasm of skin: Secondary | ICD-10-CM | POA: Diagnosis not present

## 2016-12-31 DIAGNOSIS — T814XXA Infection following a procedure, initial encounter: Secondary | ICD-10-CM | POA: Diagnosis not present

## 2016-12-31 DIAGNOSIS — Y828 Other medical devices associated with adverse incidents: Secondary | ICD-10-CM | POA: Diagnosis not present

## 2016-12-31 DIAGNOSIS — N184 Chronic kidney disease, stage 4 (severe): Secondary | ICD-10-CM | POA: Insufficient documentation

## 2016-12-31 DIAGNOSIS — L7622 Postprocedural hemorrhage and hematoma of skin and subcutaneous tissue following other procedure: Secondary | ICD-10-CM | POA: Insufficient documentation

## 2016-12-31 DIAGNOSIS — T8140XA Infection following a procedure, unspecified, initial encounter: Secondary | ICD-10-CM

## 2016-12-31 DIAGNOSIS — R58 Hemorrhage, not elsewhere classified: Secondary | ICD-10-CM

## 2016-12-31 DIAGNOSIS — E039 Hypothyroidism, unspecified: Secondary | ICD-10-CM | POA: Diagnosis not present

## 2016-12-31 DIAGNOSIS — I129 Hypertensive chronic kidney disease with stage 1 through stage 4 chronic kidney disease, or unspecified chronic kidney disease: Secondary | ICD-10-CM | POA: Insufficient documentation

## 2016-12-31 HISTORY — DX: Malignant (primary) neoplasm, unspecified: C80.1

## 2016-12-31 NOTE — ED Triage Notes (Signed)
Daughter states that the patient had some basal cell carcinomas removed from her head and face yesterday.  They did not bleed yesterday at home.  They started bleeding today and family family got it stopped.  Called the MD and they told me to put an Ice pack near the areas.  We got her into the bed and they started bleeding again.

## 2017-01-01 DIAGNOSIS — T814XXA Infection following a procedure, initial encounter: Secondary | ICD-10-CM | POA: Diagnosis not present

## 2017-01-01 LAB — CBC WITH DIFFERENTIAL/PLATELET
Basophils Absolute: 0 10*3/uL (ref 0.0–0.1)
Basophils Relative: 0 %
EOS ABS: 0.2 10*3/uL (ref 0.0–0.7)
EOS PCT: 2 %
HCT: 31.1 % — ABNORMAL LOW (ref 36.0–46.0)
Hemoglobin: 10.4 g/dL — ABNORMAL LOW (ref 12.0–15.0)
LYMPHS ABS: 2 10*3/uL (ref 0.7–4.0)
Lymphocytes Relative: 19 %
MCH: 32.9 pg (ref 26.0–34.0)
MCHC: 33.4 g/dL (ref 30.0–36.0)
MCV: 98.4 fL (ref 78.0–100.0)
MONOS PCT: 6 %
Monocytes Absolute: 0.6 10*3/uL (ref 0.1–1.0)
Neutro Abs: 7.6 10*3/uL (ref 1.7–7.7)
Neutrophils Relative %: 73 %
PLATELETS: 282 10*3/uL (ref 150–400)
RBC: 3.16 MIL/uL — ABNORMAL LOW (ref 3.87–5.11)
RDW: 13.3 % (ref 11.5–15.5)
WBC: 10.5 10*3/uL (ref 4.0–10.5)

## 2017-01-01 LAB — BASIC METABOLIC PANEL
Anion gap: 6 (ref 5–15)
BUN: 44 mg/dL — AB (ref 6–20)
CALCIUM: 8.9 mg/dL (ref 8.9–10.3)
CHLORIDE: 100 mmol/L — AB (ref 101–111)
CO2: 27 mmol/L (ref 22–32)
CREATININE: 2.06 mg/dL — AB (ref 0.44–1.00)
GFR calc Af Amer: 22 mL/min — ABNORMAL LOW (ref >60)
GFR, EST NON AFRICAN AMERICAN: 19 mL/min — AB (ref >60)
Glucose, Bld: 110 mg/dL — ABNORMAL HIGH (ref 65–99)
Potassium: 5.3 mmol/L — ABNORMAL HIGH (ref 3.5–5.1)
SODIUM: 133 mmol/L — AB (ref 135–145)

## 2017-01-01 MED ORDER — SILVER NITRATE-POT NITRATE 75-25 % EX MISC
1.0000 "application " | Freq: Once | CUTANEOUS | Status: AC
  Administered 2017-01-01: 1 via TOPICAL
  Filled 2017-01-01: qty 1

## 2017-01-01 NOTE — ED Provider Notes (Signed)
Amanda Prince DEPT Provider Note   CSN: [id number] Arrival date & time: 12/31/16  2300   By signing my name below, I, Amanda Prince, attest that this documentation has been prepared under the direction and in the presence of [name], MD. Electronically Signed: Hilbert Prince, Scribe. 01/01/17. 1:06 AM.  Time seen 12:23 PM  History   Chief Complaint Chief Complaint  Patient presents with  . Post-op Problem   Level 5 caveat for dementia   The history is provided by the patient and a relative. No language interpreter was used.  HPI Comments: Amanda Prince is a 83 y.o. female brought in by ambulance, who presents to the Emergency Department complaining of bleeding that occurred around 2pm on 01/01/2017 s/p removal of basal cell carcinoma on 12/30/2016. The patient went to her dermatologist on 12/30/2016 and had several basal carcinoma removed from her head at that time. Around 2 pm today, a family member noted that a spot on her head was spurting blood. They called the doctor and was advised to place ice on the spot and they noticed that the bleeding resolved at that time. Around 10 pm tonight it began to bleed again. They report heavy bleeding. The patient is not on any blood thinners.  PCP Dr Harmon Pier  Past Medical History:  Diagnosis Date  . Anxiety   . Arthritis   . Cancer ([place])    basal cell skin cancer  . HTN (hypertension)   . Kidney stone   . Seasonal allergies   . Thyroid disease   . Vertigo     Patient Active Problem List   Diagnosis Date Noted  . Dehydration 12/07/2015  . AKI (acute kidney injury) ([place]) 12/07/2015  . CKD (chronic kidney disease) stage 4, GFR 15-29 ml/min (HCC) 12/07/2015  . Acute encephalopathy 12/07/2015  . Sinusitis, acute 12/07/2015  . Postoperative anemia due to acute blood loss 08/04/2015  . Anemia, chronic disease 08/04/2015  . Renal insufficiency 08/04/2015  . Hip fracture, right ([place]) 07/18/2015  . Hypothyroidism 07/18/2015  . HTN  (hypertension) 07/18/2015  . Hip fracture ([place]) 07/18/2015  . Fracture of wrist 07/16/2011  . S/P wrist surgery 05/16/2011  . Wrist fracture, closed 05/08/2011  . Wrist fracture, open 05/08/2011    Past Surgical History:  Procedure Laterality Date  . GALLBLADDER SURGERY    . kidney stones    . ORIF HIP FRACTURE Right 07/19/2015   Procedure: OPEN REDUCTION INTERNAL FIXATION HIP;  Surgeon: Sanjuana Kava, MD;  Location: [facility];  Service: Orthopedics;  Laterality: Right;  . otif right wrist  Dr Aline Brochure 2012  . WRIST FRACTURE SURGERY      OB History    No data available       Home Medications    Prior to Admission medications   Medication Sig Start Date End Date Taking? Authorizing Provider  acetaminophen (TYLENOL) 325 MG tablet Take 2 tablets (650 mg total) by mouth every 6 (six) hours as needed for fever (prn temp greater than 101 F). 07/21/15  Yes Estela Leonie Green, MD  Artificial Tear Ointment (ARTIFICIAL TEARS) ointment Place 1 drop into both eyes as needed (Dry eye).   Yes Historical Provider, MD  Cyanocobalamin (VITAMIN B12 PO) Take 1 tablet by mouth daily.    Yes Historical Provider, MD  donepezil (ARICEPT) 5 MG tablet  01/12/16  Yes Historical Provider, MD  furosemide (LASIX) 20 MG tablet  01/29/16  Yes Historical Provider, MD  levothyroxine (SYNTHROID, LEVOTHROID) 88 MCG tablet Take  88 mcg by mouth daily. Also takes 167mcg on alternating days   Yes Historical Provider, MD  loratadine (CLARITIN) 10 MG tablet Take 10 mg by mouth daily.     Yes Historical Provider, MD  LORazepam (ATIVAN) 1 MG tablet Take 1 tablet (1 mg total) by mouth every 8 (eight) hours. 07/21/15  Yes Erline Hau, MD  meclizine (ANTIVERT) 32 MG tablet Take 25 mg by mouth 3 (three) times daily as needed.    Yes Historical Provider, MD  Pyridoxine HCl (VITAMIN B6 PO) Take 1 tablet by mouth daily.    Yes Historical Provider, MD  dextromethorphan-guaiFENesin (TUSSIN DM) 10-100 MG/5ML liquid  Take 10 mLs by mouth every 6 (six) hours as needed for cough.    Historical Provider, MD  metoprolol tartrate (LOPRESSOR) 25 MG tablet Take 1 tablet (25 mg total) by mouth 2 (two) times daily. 12/09/15   Kathie Dike, MD  valsartan-hydrochlorothiazide (DIOVAN-HCT) 80-12.5 MG tablet  12/04/15   Historical Provider, MD  zolpidem (AMBIEN) 5 MG tablet Take 5 mg by mouth at bedtime as needed for sleep.    Historical Provider, MD    Family History Family History  Problem Relation Age of Onset  . Heart disease      Social History Social History  Substance Use Topics  . Smoking status: Never Smoker  . Smokeless tobacco: Never Used  . Alcohol use No  lives at home   Allergies   Sulfa antibiotics   Review of Systems Review of Systems  All other systems reviewed and are negative.    Physical Exam Updated Vital Signs BP (!) 95/45 (BP Location: Right Arm)   Pulse 76   Temp 98.4 F (36.9 C) (Oral)   Resp 16   Ht 5\' 2"  (1.575 m)   Wt 130 lb (59 kg)   SpO2 97%   BMI 23.78 kg/m   Vital signs normal borderline BP   Physical Exam  Constitutional: She is oriented to person, place, and time. She appears well-developed and well-nourished.  Non-toxic appearance. She does not appear ill. No distress.  HENT:  Head: Normocephalic.  Right Ear: External ear normal.  Left Ear: External ear normal.  Nose: Nose normal. No mucosal edema or rhinorrhea.  Mouth/Throat: Oropharynx is clear and moist and mucous membranes are normal. No dental abscesses or uvula swelling.  Eyes: Conjunctivae and EOM are normal. Pupils are equal, round, and reactive to light.  Neck: Normal range of motion and full passive range of motion without pain. Neck supple.  Cardiovascular: Normal rate, regular rhythm and normal heart sounds.  Exam reveals no gallop and no friction rub.   No murmur heard. Pulmonary/Chest: Effort normal and breath sounds normal. No respiratory distress. She has no wheezes. She has no  rhonchi. She has no rales. She exhibits no tenderness and no crepitus.  Abdominal: Soft. Normal appearance and bowel sounds are normal. She exhibits no distension. There is no tenderness. There is no rebound and no guarding.  Musculoskeletal: Normal range of motion. She exhibits no edema or tenderness.  Moves all extremities well.   Neurological: She is alert and oriented to person, place, and time. She has normal strength. No cranial nerve deficit.  Skin: Skin is warm, dry and intact. No rash noted. No erythema. No pallor.  She has a round area on her left scalp aproximately 1.5 cm in size. She has 2 areas on top of her scalp about 1.5 cm in size each. She has dried blood in  front her right ear where the family states that there is another site which is not actively bleed at this time.  Psychiatric: She has a normal mood and affect. Her speech is normal and behavior is normal. Her mood appears not anxious.  Nursing note and vitals reviewed.        ED Treatments / Results  DIAGNOSTIC STUDIES: Oxygen Saturation is 97% on RA, normal by my interpretation.    Labs (all labs ordered are listed, but only abnormal results are displayed) Results for orders placed or performed during the hospital encounter of 12/31/16  CBC with Differential  Result Value Ref Range   WBC 10.5 4.0 - 10.5 K/uL   RBC 3.16 (L) 3.87 - 5.11 MIL/uL   Hemoglobin 10.4 (L) 12.0 - 15.0 g/dL   HCT 31.1 (L) 36.0 - 46.0 %   MCV 98.4 78.0 - 100.0 fL   MCH 32.9 26.0 - 34.0 pg   MCHC 33.4 30.0 - 36.0 g/dL   RDW 13.3 11.5 - 15.5 %   Platelets 282 150 - 400 K/uL   Neutrophils Relative % 73 %   Neutro Abs 7.6 1.7 - 7.7 K/uL   Lymphocytes Relative 19 %   Lymphs Abs 2.0 0.7 - 4.0 K/uL   Monocytes Relative 6 %   Monocytes Absolute 0.6 0.1 - 1.0 K/uL   Eosinophils Relative 2 %   Eosinophils Absolute 0.2 0.0 - 0.7 K/uL   Basophils Relative 0 %   Basophils Absolute 0.0 0.0 - 0.1 K/uL  Basic metabolic panel  Result Value Ref  Range   Sodium 133 (L) 135 - 145 mmol/L   Potassium 5.3 (H) 3.5 - 5.1 mmol/L   Chloride 100 (L) 101 - 111 mmol/L   CO2 27 22 - 32 mmol/L   Glucose, Bld 110 (H) 65 - 99 mg/dL   BUN 44 (H) 6 - 20 mg/dL   Creatinine, Ser 2.06 (H) 0.44 - 1.00 mg/dL   Calcium 8.9 8.9 - 10.3 mg/dL   GFR calc non Af Amer 19 (L) >60 mL/min   GFR calc Af Amer 22 (L) >60 mL/min   Anion gap 6 5 - 15   Laboratory interpretation all normal except stable anemia, mild hyperkalemia, renal insufficiency     Procedures Procedures (including critical care time)  Medications Ordered in ED Medications  silver nitrate applicators applicator 1 application (1 application Topical Given 01/01/17 0050)     Initial Impression / Assessment and Plan / ED Course  I have reviewed the triage vital signs and the nursing notes.  Pertinent labs & imaging results that were available during my care of the patient were reviewed by me and considered in my medical decision making (see chart for details).  COORDINATION OF CARE: 12:23 AM Discussed treatment plan with pt at bedside, which includes labs, and pt agreed to plan.   The wound in front of the right ear was cleaned. Most of the wounds have some bruising around her edges. I cauterized the rim of the lesion in front of her right ear which I think was the one that was bleeding tonight. Surgicel was placed on this wound. Quick clot was placed on the areas on the top of her scalp and then a pressure pressure dressing was applied.  Patient's hemoglobin is stable. She was discharged home with her family. They are to change the outer dressing, however the dressings that are directly on the wounds should be left in place until they fall off which will  probably be about a week. Hopefully by then the wounds will have a healing edge to them.  Final Clinical Impressions(s) / ED Diagnoses   Final diagnoses:  Postoperative infection, initial encounter  Bleeding    Plan discharge  [name], MD, Barbette Or, MD 01/01/17 [phone]

## 2017-01-01 NOTE — Discharge Instructions (Signed)
Keep the dressings on these wounds until they fall off. Follow up with the dermatologist as scheduled.

## 2017-01-12 ENCOUNTER — Encounter (HOSPITAL_COMMUNITY): Payer: Self-pay | Admitting: *Deleted

## 2017-01-12 ENCOUNTER — Emergency Department (HOSPITAL_COMMUNITY): Payer: Medicare Other

## 2017-01-12 ENCOUNTER — Observation Stay (HOSPITAL_COMMUNITY)
Admission: EM | Admit: 2017-01-12 | Discharge: 2017-01-14 | Disposition: A | Discharge status: Home / Self Care | Payer: Medicare Other | Attending: Internal Medicine | Admitting: Internal Medicine

## 2017-01-12 DIAGNOSIS — R58 Hemorrhage, not elsewhere classified: Secondary | ICD-10-CM | POA: Diagnosis present

## 2017-01-12 DIAGNOSIS — Z85820 Personal history of malignant melanoma of skin: Secondary | ICD-10-CM | POA: Insufficient documentation

## 2017-01-12 DIAGNOSIS — L7622 Postprocedural hemorrhage and hematoma of skin and subcutaneous tissue following other procedure: Secondary | ICD-10-CM | POA: Diagnosis not present

## 2017-01-12 DIAGNOSIS — N184 Chronic kidney disease, stage 4 (severe): Secondary | ICD-10-CM | POA: Diagnosis present

## 2017-01-12 DIAGNOSIS — D62 Acute posthemorrhagic anemia: Secondary | ICD-10-CM | POA: Diagnosis present

## 2017-01-12 DIAGNOSIS — I129 Hypertensive chronic kidney disease with stage 1 through stage 4 chronic kidney disease, or unspecified chronic kidney disease: Secondary | ICD-10-CM | POA: Insufficient documentation

## 2017-01-12 DIAGNOSIS — T148XXA Other injury of unspecified body region, initial encounter: Secondary | ICD-10-CM

## 2017-01-12 DIAGNOSIS — R29810 Facial weakness: Secondary | ICD-10-CM | POA: Diagnosis not present

## 2017-01-12 DIAGNOSIS — E039 Hypothyroidism, unspecified: Secondary | ICD-10-CM | POA: Diagnosis present

## 2017-01-12 DIAGNOSIS — Z79899 Other long term (current) drug therapy: Secondary | ICD-10-CM | POA: Diagnosis not present

## 2017-01-12 DIAGNOSIS — I10 Essential (primary) hypertension: Secondary | ICD-10-CM | POA: Diagnosis present

## 2017-01-12 DIAGNOSIS — R55 Syncope and collapse: Secondary | ICD-10-CM | POA: Diagnosis not present

## 2017-01-12 DIAGNOSIS — R011 Cardiac murmur, unspecified: Secondary | ICD-10-CM | POA: Diagnosis present

## 2017-01-12 LAB — BASIC METABOLIC PANEL
Anion gap: 6 (ref 5–15)
BUN: 42 mg/dL — AB (ref 6–20)
CALCIUM: 8.9 mg/dL (ref 8.9–10.3)
CO2: 25 mmol/L (ref 22–32)
Chloride: 107 mmol/L (ref 101–111)
Creatinine, Ser: 2.36 mg/dL — ABNORMAL HIGH (ref 0.44–1.00)
GFR calc Af Amer: 19 mL/min — ABNORMAL LOW (ref >60)
GFR, EST NON AFRICAN AMERICAN: 16 mL/min — AB (ref >60)
GLUCOSE: 107 mg/dL — AB (ref 65–99)
Potassium: 5.1 mmol/L (ref 3.5–5.1)
Sodium: 138 mmol/L (ref 135–145)

## 2017-01-12 LAB — HEPATIC FUNCTION PANEL
ALBUMIN: 3.4 g/dL — AB (ref 3.5–5.0)
ALK PHOS: 57 U/L (ref 38–126)
ALT: 17 U/L (ref 14–54)
AST: 26 U/L (ref 15–41)
BILIRUBIN TOTAL: 0.4 mg/dL (ref 0.3–1.2)
Bilirubin, Direct: 0.1 mg/dL (ref 0.1–0.5)
Indirect Bilirubin: 0.3 mg/dL (ref 0.3–0.9)
Total Protein: 6.4 g/dL — ABNORMAL LOW (ref 6.5–8.1)

## 2017-01-12 LAB — CBC WITH DIFFERENTIAL/PLATELET
BASOS PCT: 0 %
Basophils Absolute: 0 10*3/uL (ref 0.0–0.1)
EOS ABS: 0.1 10*3/uL (ref 0.0–0.7)
EOS PCT: 1 %
HCT: 25.5 % — ABNORMAL LOW (ref 36.0–46.0)
Hemoglobin: 8.5 g/dL — ABNORMAL LOW (ref 12.0–15.0)
Lymphocytes Relative: 17 %
Lymphs Abs: 1.8 10*3/uL (ref 0.7–4.0)
MCH: 32.8 pg (ref 26.0–34.0)
MCHC: 33.3 g/dL (ref 30.0–36.0)
MCV: 98.5 fL (ref 78.0–100.0)
MONO ABS: 0.4 10*3/uL (ref 0.1–1.0)
MONOS PCT: 4 %
NEUTROS PCT: 78 %
Neutro Abs: 8.1 10*3/uL — ABNORMAL HIGH (ref 1.7–7.7)
PLATELETS: 325 10*3/uL (ref 150–400)
RBC: 2.59 MIL/uL — ABNORMAL LOW (ref 3.87–5.11)
RDW: 14 % (ref 11.5–15.5)
WBC: 10.3 10*3/uL (ref 4.0–10.5)

## 2017-01-12 LAB — PROTIME-INR
INR: 1.03
PROTHROMBIN TIME: 13.5 s (ref 11.4–15.2)

## 2017-01-12 LAB — TROPONIN I
Troponin I: <0.03 ng/mL (ref <0.03)
Troponin I: <0.03 ng/mL (ref <0.03)

## 2017-01-12 LAB — MAGNESIUM: Magnesium: 1.9 mg/dL (ref 1.7–2.4)

## 2017-01-12 MED ORDER — LORATADINE 10 MG PO TABS
10.0000 mg | ORAL_TABLET | Freq: Every day | ORAL | Status: DC
  Administered 2017-01-12 – 2017-01-14 (×3): 10 mg via ORAL
  Filled 2017-01-12 (×3): qty 1

## 2017-01-12 MED ORDER — MECLIZINE HCL 12.5 MG PO TABS: 25.0000 mg | ORAL_TABLET | Freq: Three times a day (TID) | ORAL | Status: DC | PRN

## 2017-01-12 MED ORDER — SODIUM CHLORIDE 0.9% FLUSH
3.0000 mL | Freq: Two times a day (BID) | INTRAVENOUS | Status: DC
  Administered 2017-01-12 – 2017-01-14 (×4): 3 mL via INTRAVENOUS

## 2017-01-12 MED ORDER — LORAZEPAM 0.5 MG PO TABS
0.5000 mg | ORAL_TABLET | Freq: Four times a day (QID) | ORAL | Status: DC | PRN
  Administered 2017-01-12 – 2017-01-14 (×2): 0.5 mg via ORAL
  Filled 2017-01-12 (×2): qty 1

## 2017-01-12 MED ORDER — HYDROGEN PEROXIDE 3 % EX SOLN
CUTANEOUS | Status: AC
  Filled 2017-01-12: qty 473

## 2017-01-12 MED ORDER — ARTIFICIAL TEARS OP OINT
1.0000 "application " | TOPICAL_OINTMENT | OPHTHALMIC | Status: DC | PRN
  Filled 2017-01-12: qty 3.5

## 2017-01-12 MED ORDER — DONEPEZIL HCL 5 MG PO TABS
10.0000 mg | ORAL_TABLET | Freq: Every day | ORAL | Status: DC
  Administered 2017-01-12 – 2017-01-14 (×3): 10 mg via ORAL
  Filled 2017-01-12 (×3): qty 2

## 2017-01-12 MED ORDER — LEVOTHYROXINE SODIUM 88 MCG PO TABS
88.0000 ug | ORAL_TABLET | Freq: Every day | ORAL | Status: DC
  Administered 2017-01-13 – 2017-01-14 (×2): 88 ug via ORAL
  Filled 2017-01-12 (×2): qty 1

## 2017-01-12 NOTE — ED Notes (Signed)
Report given to Ander Purpura, RN for 3A.

## 2017-01-12 NOTE — ED Triage Notes (Signed)
Pt returned to er for bleeding from areas on scalp that started tonight,  Pt had basal cell carcinomas removed on 12/30/2016.

## 2017-01-12 NOTE — ED Notes (Signed)
Surgical incision to left side of scalp was cleaned with peroxide per MD order.

## 2017-01-12 NOTE — H&P (Signed)
History and Physical    Amanda Prince [id number] DOB: 10/02/1917 DOA: 01/12/2017  PCP: Clinton Quant, MD   Patient coming from: Home.  Chief Complaint: Bleeding.  HPI: Amanda Prince is a 83 y.o. female with medical history significant of anxiety, osteoarthritis, hypertension, urolithiasis, seasonal allergies, hypothyroidism, vertigo, basal cell skin cancer of the scalp and face who recently underwent treatment for removal of this lesion on February 5, came to the ED on February 7, has surgery so placed in the wounds for bleeding and now returns to the ER after having persistent bleeding from her right temple area wound this morning.  Per patient's daughters, the lesions had been bleeding since last night. The bleeding has been significant enough to soak some dressings. Earlier in the morning, the patient woke up and went to the bathroom, was unable to get out from the toilet. Her daughters subsequently called the neighbor and EMS, who once on scene apparently witnessed the patient syncopized and have LOC briefly and a facial droop. The patient denies chest pain, palpitations, dizziness, diaphoresis, dyspnea, PND or orthopnea.  ED Course: Workup in the emergency department showed EKG is sinus rhythm with nonspecific T-wave abnormality in anterolateral leads. Her hemoglobin level has decreased to 8.5 today from 10.3 g/dL 11 days ago. WBC 10.3 and platelets 325. PT and INR within normal limits. Troponin level was negative. Sodium 138, potassium 5.1, chloride 107, bicarbonate 25 mmol/L. BUN was 42, previously 44, creatinine is 2.36 today and 11 days ago was 2.06. Her glucose level was 107. Total protein was 6.4 and albumin 3.4 g/dL, otherwise her LFTs are within normal range.  Imaging: Her chest radiograph showed stable fibrotic changes at the bases, stable right hemidiaphragm eventration and aortic atherosclerosis. A CT scan of the head shows stable atrophy, small vessel disease and some  areas of arterial vascular calcification  Review of Systems: As per HPI otherwise 10 point review of systems negative.    Past Medical History:  Diagnosis Date  . Anxiety   . Arthritis   . Cancer ([place])    basal cell skin cancer  . HTN (hypertension)   . Kidney stone   . Seasonal allergies   . Thyroid disease   . Vertigo     Past Surgical History:  Procedure Laterality Date  . GALLBLADDER SURGERY    . kidney stones    . ORIF HIP FRACTURE Right 07/19/2015   Procedure: OPEN REDUCTION INTERNAL FIXATION HIP;  Surgeon: Sanjuana Kava, MD;  Location: [facility];  Service: Orthopedics;  Laterality: Right;  . otif right wrist  Dr Aline Brochure 2012  . WRIST FRACTURE SURGERY       reports that she has never smoked. She has never used smokeless tobacco. She reports that she does not drink alcohol or use drugs.  Allergies  Allergen Reactions  . Other Shortness Of Breath    Spray the dermatology used recently   . Sulfa Antibiotics     Family History  Problem Relation Age of Onset  . Heart disease      Prior to Admission medications   Medication Sig Start Date End Date Taking? Authorizing Provider  Artificial Tear Ointment (ARTIFICIAL TEARS) ointment Place 1 drop into both eyes as needed (Dry eye).   Yes Historical Provider, MD  Cyanocobalamin (VITAMIN B12 PO) Take 1 tablet by mouth daily.    Yes Historical Provider, MD  donepezil (ARICEPT) 10 MG tablet Take 1 tablet by mouth daily. 11/20/16  Yes Historical Provider, MD  furosemide (LASIX) 20 MG tablet Take 20 mg by mouth daily.  01/29/16  Yes Historical Provider, MD  loratadine (CLARITIN) 10 MG tablet Take 10 mg by mouth daily.     Yes Historical Provider, MD  LORazepam (ATIVAN) 0.5 MG tablet Take 1 tablet by mouth daily. 12/16/16  Yes Historical Provider, MD  meclizine (ANTIVERT) 32 MG tablet Take 25 mg by mouth 3 (three) times daily as needed.    Yes Historical Provider, MD  metoprolol tartrate (LOPRESSOR) 25 MG tablet Take 1 tablet (25 mg  total) by mouth 2 (two) times daily. 12/09/15  Yes Kathie Dike, MD  Pyridoxine HCl (VITAMIN B6 PO) Take 1 tablet by mouth daily.    Yes Historical Provider, MD  SYNTHROID 100 MCG tablet Take 88-100 mcg by mouth daily. Alternate 88-100 mcg every other day 01/06/17  Yes Historical Provider, MD    Physical Exam:  Constitutional: NAD, calm, comfortable Vitals:   01/12/17 1430 01/12/17 1500 01/12/17 1530 01/12/17 1600  BP: 155/56 154/57 99/88 147/62  Pulse: 80 86 78 82  Resp: 21 22 23 16   Temp:      TempSrc:      SpO2: 99% 98% 98% 96%  Weight:      Height:       Eyes: PERRL, lids and conjunctivae normal ENMT: Moderate hearing impairment. Mucous membranes are moist. Posterior pharynx clear of any exudate or lesions. Neck: normal, supple, no masses, no thyromegaly Respiratory: clear to auscultation bilaterally, no wheezing, no crackles. Normal respiratory effort. No accessory muscle use.  Cardiovascular: Regular rate and rhythm, 3/6 systolic ejection murmur, no rubs / gallops. No extremity edema. 2+ pedal pulses. No carotid bruits.  Abdomen: Bowel sounds positive. Soft, no tenderness, no masses palpated. No hepatosplenomegaly.  Musculoskeletal: no clubbing / cyanosis.Good ROM, no contractures. Normal muscle tone.  Skin: Multiple scab lesions on scalp and right temple area. There is dry blood on right temple area. Please see pictures below. Neurologic: CN 2-12 grossly intact. Sensation intact, DTR normal. Strength 5/5 in all 4.  Psychiatric: Alert and oriented x 2, partially oriented to time. Mildly anxious mood.      Labs on Admission: I have personally reviewed following labs and imaging studies  CBC:  Recent Labs Lab 01/12/17 0834  WBC 10.3  NEUTROABS 8.1*  HGB 8.5*  HCT 25.5*  MCV 98.5  PLT [id number]   Basic Metabolic Panel:  Recent Labs Lab 01/12/17 0829 01/12/17 1327  NA 138  --   K 5.1  --   CL 107  --   CO2 25  --   GLUCOSE 107*  --   BUN 42*  --   CREATININE  2.36*  --   CALCIUM 8.9  --   MG  --  1.9   GFR: Estimated Creatinine Clearance: 12.3 mL/min (by C-G formula based on SCr of 2.36 mg/dL (H)). Liver Function Tests:  Recent Labs Lab 01/12/17 1327  AST 26  ALT 17  ALKPHOS 57  BILITOT 0.4  PROT 6.4*  ALBUMIN 3.4*   No results for input(s): LIPASE, AMYLASE in the last 168 hours. No results for input(s): AMMONIA in the last 168 hours. Coagulation Profile:  Recent Labs Lab 01/12/17 0834  INR 1.03   Cardiac Enzymes:  Recent Labs Lab 01/12/17 0829 01/12/17 1327  TROPONINI <0.03 <0.03   BNP (last 3 results) No results for input(s): PROBNP in the last 8760 hours. HbA1C: No results for input(s): HGBA1C in the last 72 hours. CBG: No results for  input(s): GLUCAP in the last 168 hours. Lipid Profile: No results for input(s): CHOL, HDL, LDLCALC, TRIG, CHOLHDL, LDLDIRECT in the last 72 hours. Thyroid Function Tests: No results for input(s): TSH, T4TOTAL, FREET4, T3FREE, THYROIDAB in the last 72 hours. Anemia Panel: No results for input(s): VITAMINB12, FOLATE, FERRITIN, TIBC, IRON, RETICCTPCT in the last 72 hours. Urine analysis:    Component Value Date/Time   COLORURINE YELLOW 12/07/2015 1145   APPEARANCEUR HAZY (A) 12/07/2015 1145   LABSPEC 1.010 12/07/2015 1145   PHURINE 5.5 12/07/2015 1145   GLUCOSEU NEGATIVE 12/07/2015 1145   HGBUR NEGATIVE 12/07/2015 1145   BILIRUBINUR NEGATIVE 12/07/2015 1145   KETONESUR NEGATIVE 12/07/2015 1145   PROTEINUR NEGATIVE 12/07/2015 1145   UROBILINOGEN 0.2 07/18/2015 2232   NITRITE NEGATIVE 12/07/2015 1145   LEUKOCYTESUR LARGE (A) 12/07/2015 1145    Radiological Exams on Admission: Dg Chest 2 View  Result Date: 01/12/2017 CLINICAL DATA:  Hypertension EXAM: CHEST  2 VIEW COMPARISON:  December 07, 2015 FINDINGS: There is eventration of the right hemidiaphragm, stable. There is mild fibrotic change in lung bases. There is no frank edema or consolidation. Heart size and pulmonary  vascularity are normal. There is atherosclerotic calcification in the aorta. No focal bone lesions. No evident adenopathy. IMPRESSION: Fibrotic change in lung bases, stable. No edema or consolidation. Stable right hemidiaphragm eventration. Aortic atherosclerosis. Electronically Signed   By: Lowella Grip III M.D.   On: 01/12/2017 08:34   Ct Head Wo Contrast  Result Date: 01/12/2017 CLINICAL DATA:  Fall with syncope and facial droop. Scalp bleeding from recent biopsy slight EXAM: CT HEAD WITHOUT CONTRAST TECHNIQUE: Contiguous axial images were obtained from the base of the skull through the vertex without intravenous contrast. COMPARISON:  December 07, 2015 FINDINGS: Brain: There is mild diffuse atrophy. There is no intracranial mass hemorrhage, extra-axial fluid collection, or midline shift. There is patchy small vessel disease in the centra semiovale bilaterally. Gray-white compartments elsewhere are normal. No acute infarct evident. Vascular: No hyperdense vessel. There is calcification in both carotid siphon regions. There is also calcification in distal vertebral arteries at the skullbase level. Skull: A bandage overlies the right frontal scalp superiorly. Bony calvarium appears intact. Sinuses/Orbits: There is mucosal thickening in several ethmoid air cells bilaterally. There is mucosal thickening in the superior left sphenoid sinus region. Other visualized paranasal sinuses are clear. Visualized orbits appear symmetric bilaterally. Other: There is opacification in several inferior mastoid air cells on the left, stable. Mastoids elsewhere bilaterally are clear. IMPRESSION: Stable atrophy with periventricular small vessel disease. No intracranial mass, hemorrhage, or extra-axial fluid collection. No acute infarct. Multiple foci of arterial vascular calcification. Areas of paranasal sinus disease. Mild inferior left mastoid air cell disease, stable. Bandage overlying the right frontal scalp region.  Electronically Signed   By: Lowella Grip III M.D.   On: 01/12/2017 13:37   Dg Humerus Left  Result Date: 01/12/2017 CLINICAL DATA:  Pain EXAM: LEFT HUMERUS - 2+ VIEW COMPARISON:  None. FINDINGS: Frontal and lateral views were obtained. No fracture or dislocation. The joint spaces appear unremarkable. No abnormal periosteal reaction. IMPRESSION: No fracture or dislocation.  No abnormal periosteal reaction. Electronically Signed   By: Lowella Grip III M.D.   On: 01/12/2017 08:35    EKG: Independently reviewed. Vent. rate 78 BPM PR interval * ms QRS duration 89 ms QT/QTc 419/478 ms P-R-T axes 65 5 15 Sinus rhythm Nonspecific T abnrm, anterolateral leads  Assessment/Plan Principal Problem:   Syncope Admit to telemetry/observation. Supplemental  oxygen as needed. Neuro checks every 4 hours. Trend troponin levels. Check echocardiogram and carotid Doppler in a.m. Follow-up hemoglobin level in a.m.  Active Problems:   Hypothyroidism Continue levothyroxine 88 g by mouth daily. Check TSH level.    HTN (hypertension) Hold antihypertensives for now. Monitor blood pressure.    Postoperative anemia due to acute blood loss Follow-up hematocrit and hemoglobin. Check anemia panel.    CKD (chronic kidney disease) stage 4, GFR 15-29 ml/min (HCC) Hold furosemide due to acute blood loss, hypotension and mild worsening renal function. Follow-up BUN, creatinine and electrolytes in a.m.    Systolic ejection murmur Check echocardiogram in a.m.   DVT prophylaxis: SCDs. Code Status: Full code. Family Communication: Her daughters are present in the emergency department and provided history. Disposition Plan: Admit for monitoring and syncope workup. Consults called:  Admission status: Observation/telemetry.   Reubin Milan MD [facility] Pager [phone].  If 7PM-7AM, please contact night-coverage [email or web address] Password [email or web address]  01/12/2017, 4:16 PM

## 2017-01-12 NOTE — ED Provider Notes (Signed)
Patient turned over to me by the overnight Dr. Dr. Kingsley Callander. Initial story was just concerned for bleeding from a basal cell carcinoma site on her scalp that was either biopsied or removed by her dermatologist in [place]. Patient did see her dermatologist [place] on Friday just a couple days ago and they opted not to do any further treatment to it. Patient was seen earlier by [place] for significant bleeding which eventually controlled with quick clot. Around 5 this morning is started to rebleed. Part of the story that we did not get. EMS stated the patient had passed out and that there was a facial droop sure what side. This was not witnessed by either of her daughters that were in the house with her. Patient's troponins 2 are negative. Patient has a decrease in her hemoglobin from the past. Which could be due to the bleeding. Based on the concerns for the syncope and the questionable facial droop head CT was ordered that was negative. She will be admitted to telemetry for observation overnight by the hospitalist team.   Fredia Sorrow, MD 01/12/17 [phone]

## 2017-01-12 NOTE — ED Provider Notes (Signed)
[facility] DEPT Provider Note   CSN: [id number] Arrival date & time: 01/12/17  0531     History   Chief Complaint Chief Complaint  Patient presents with  . Bleeding/Bruising    HPI Amanda Prince is a 83 y.o. female.  Patient from home with bleeding from site of previous basal cell skin cancer removal. She was seen in the hospital on February 7 and had Surgicel placed in the wounds. Her actual surgery was on february fifth. Denies any falls or trauma. Daughter reports she noticed blood coming from the patient's right temple overnight tonight. Patient denies any dizziness or lightheadedness. She denies any chest pain or shortness of breath. Her left upper arm has been hurting since she arrived to the hospital. Denies any falls or trauma. She does not take any blood thinners at home.   The history is provided by the patient and a relative.    Past Medical History:  Diagnosis Date  . Anxiety   . Arthritis   . Cancer ([place])    basal cell skin cancer  . HTN (hypertension)   . Kidney stone   . Seasonal allergies   . Thyroid disease   . Vertigo     Patient Active Problem List   Diagnosis Date Noted  . Dehydration 12/07/2015  . AKI (acute kidney injury) ([place]) 12/07/2015  . CKD (chronic kidney disease) stage 4, GFR 15-29 ml/min (HCC) 12/07/2015  . Acute encephalopathy 12/07/2015  . Sinusitis, acute 12/07/2015  . Postoperative anemia due to acute blood loss 08/04/2015  . Anemia, chronic disease 08/04/2015  . Renal insufficiency 08/04/2015  . Hip fracture, right ([place]) 07/18/2015  . Hypothyroidism 07/18/2015  . HTN (hypertension) 07/18/2015  . Hip fracture ([place]) 07/18/2015  . Fracture of wrist 07/16/2011  . S/P wrist surgery 05/16/2011  . Wrist fracture, closed 05/08/2011  . Wrist fracture, open 05/08/2011    Past Surgical History:  Procedure Laterality Date  . GALLBLADDER SURGERY    . kidney stones    . ORIF HIP FRACTURE Right 07/19/2015   Procedure: OPEN  REDUCTION INTERNAL FIXATION HIP;  Surgeon: Sanjuana Kava, MD;  Location: [facility];  Service: Orthopedics;  Laterality: Right;  . otif right wrist  Dr Aline Brochure 2012  . WRIST FRACTURE SURGERY      OB History    No data available       Home Medications    Prior to Admission medications   Medication Sig Start Date End Date Taking? Authorizing Provider  acetaminophen (TYLENOL) 325 MG tablet Take 2 tablets (650 mg total) by mouth every 6 (six) hours as needed for fever (prn temp greater than 101 F). 07/21/15   Erline Hau, MD  Artificial Tear Ointment (ARTIFICIAL TEARS) ointment Place 1 drop into both eyes as needed (Dry eye).    Historical Provider, MD  Cyanocobalamin (VITAMIN B12 PO) Take 1 tablet by mouth daily.     Historical Provider, MD  dextromethorphan-guaiFENesin (TUSSIN DM) 10-100 MG/5ML liquid Take 10 mLs by mouth every 6 (six) hours as needed for cough.    Historical Provider, MD  donepezil (ARICEPT) 5 MG tablet  01/12/16   Historical Provider, MD  furosemide (LASIX) 20 MG tablet  01/29/16   Historical Provider, MD  levothyroxine (SYNTHROID, LEVOTHROID) 88 MCG tablet Take 88 mcg by mouth daily. Also takes 112mcg on alternating days    Historical Provider, MD  loratadine (CLARITIN) 10 MG tablet Take 10 mg by mouth daily.      Historical Provider,  MD  LORazepam (ATIVAN) 1 MG tablet Take 1 tablet (1 mg total) by mouth every 8 (eight) hours. 07/21/15   Erline Hau, MD  meclizine (ANTIVERT) 32 MG tablet Take 25 mg by mouth 3 (three) times daily as needed.     Historical Provider, MD  metoprolol tartrate (LOPRESSOR) 25 MG tablet Take 1 tablet (25 mg total) by mouth 2 (two) times daily. 12/09/15   Kathie Dike, MD  Pyridoxine HCl (VITAMIN B6 PO) Take 1 tablet by mouth daily.     Historical Provider, MD  valsartan-hydrochlorothiazide (DIOVAN-HCT) 80-12.5 MG tablet  12/04/15   Historical Provider, MD  zolpidem (AMBIEN) 5 MG tablet Take 5 mg by mouth at bedtime as needed  for sleep.    Historical Provider, MD    Family History Family History  Problem Relation Age of Onset  . Heart disease      Social History Social History  Substance Use Topics  . Smoking status: Never Smoker  . Smokeless tobacco: Never Used  . Alcohol use No     Allergies   Sulfa antibiotics   Review of Systems Review of Systems  Constitutional: Negative for activity change, appetite change and fever.  HENT: Negative for congestion, ear pain, rhinorrhea and sore throat.   Respiratory: Negative for cough, chest tightness and shortness of breath.   Cardiovascular: Negative for chest pain and leg swelling.  Gastrointestinal: Negative for abdominal pain, nausea and vomiting.  Genitourinary: Negative for dysuria and hematuria.  Skin: Positive for wound.  Neurological: Negative for dizziness, weakness, light-headedness and headaches.   A complete 10 system review of systems was obtained and all systems are negative except as noted in the HPI and PMH.    Physical Exam Updated Vital Signs BP (!) 92/41   Pulse 70   Temp 97.7 F (36.5 C) (Oral)   Resp 17   Ht 5\' 5"  (1.651 m)   Wt 130 lb (59 kg)   SpO2 94%   BMI 21.63 kg/m   Physical Exam  Constitutional: She is oriented to person, place, and time. She appears well-developed and well-nourished. No distress.  HENT:  Head: Normocephalic and atraumatic.  Mouth/Throat: Oropharynx is clear and moist. No oropharyngeal exudate.  2 cm area of dried blood to R temple. Scabbed lesions to vertex of scalp  Eyes: Conjunctivae and EOM are normal. Pupils are equal, round, and reactive to light.  Neck: Normal range of motion. Neck supple.  No meningismus.  Cardiovascular: Normal rate, regular rhythm, normal heart sounds and intact distal pulses.   No murmur heard. Pulmonary/Chest: Effort normal and breath sounds normal. No respiratory distress.  Abdominal: Soft. There is no tenderness. There is no rebound and no guarding.    Musculoskeletal: Normal range of motion. She exhibits no edema or tenderness.  TTP L upper arm without. FROM shoulder and elbow  Neurological: She is alert and oriented to person, place, and time. No cranial nerve deficit. She exhibits normal muscle tone. Coordination normal.  Moves all extremities well  Skin: Skin is warm.  Psychiatric: She has a normal mood and affect. Her behavior is normal.  Nursing note and vitals reviewed.        ED Treatments / Results  Labs (all labs ordered are listed, but only abnormal results are displayed) Labs Reviewed  CBC WITH DIFFERENTIAL/PLATELET  PROTIME-INR  BASIC METABOLIC PANEL  TROPONIN I    EKG  EKG Interpretation  Date/Time:  Sunday January 12 2017 06:53:44 EST Ventricular Rate:  73  PR Interval:    QRS Duration: 89 QT Interval:  419 QTC Calculation: 478 R Axis:   5 Text Interpretation:  Sinus rhythm Nonspecific T abnrm, anterolateral leads new T wave inversions Confirmed by Wyvonnia Dusky  MD, Jamear Carbonneau ([id number]) on 01/12/2017 6:58:04 AM       Radiology No results found.  Procedures Procedures (including critical care time)  Medications Ordered in ED Medications - No data to display   Initial Impression / Assessment and Plan / ED Course  I have reviewed the triage vital signs and the nursing notes.  Pertinent labs & imaging results that were available during my care of the patient were reviewed by me and considered in my medical decision making (see chart for details).    Patient presents with bleeding from operative wound.  On exam there is dried blood and surgicel in the wound.   Patient's labs are pending. There is no active bleeding on arrival. Peroxide was used to remove the dried blood without active bleeding.   labs will be obtained. Patient complaining of left arm pain that started since she arrived in the ED. This appears to be musculoskeletal. However EKG does have new T-wave inversions inferior laterally.   Labs  will be obtained including troponin. Care transferred to Dr. Rogene Houston at shift change.  Plan for serial troponins and reassessment.     Final Clinical Impressions(s) / ED Diagnoses   Final diagnoses:  Bleeding from wound    New Prescriptions New Prescriptions   No medications on file     Ezequiel Essex, MD 01/12/17 [phone]

## 2017-01-13 ENCOUNTER — Observation Stay (HOSPITAL_COMMUNITY): Payer: Medicare Other

## 2017-01-13 DIAGNOSIS — L7622 Postprocedural hemorrhage and hematoma of skin and subcutaneous tissue following other procedure: Secondary | ICD-10-CM | POA: Diagnosis not present

## 2017-01-13 DIAGNOSIS — N184 Chronic kidney disease, stage 4 (severe): Secondary | ICD-10-CM | POA: Diagnosis not present

## 2017-01-13 DIAGNOSIS — R55 Syncope and collapse: Secondary | ICD-10-CM | POA: Diagnosis not present

## 2017-01-13 DIAGNOSIS — R29810 Facial weakness: Secondary | ICD-10-CM | POA: Diagnosis not present

## 2017-01-13 DIAGNOSIS — D62 Acute posthemorrhagic anemia: Secondary | ICD-10-CM

## 2017-01-13 DIAGNOSIS — I129 Hypertensive chronic kidney disease with stage 1 through stage 4 chronic kidney disease, or unspecified chronic kidney disease: Secondary | ICD-10-CM | POA: Diagnosis not present

## 2017-01-13 LAB — RETICULOCYTES
RBC.: 2.38 MIL/uL — ABNORMAL LOW (ref 3.87–5.11)
Retic Count, Absolute: 64.3 10*3/uL (ref 19.0–186.0)
Retic Ct Pct: 2.7 % (ref 0.4–3.1)

## 2017-01-13 LAB — FOLATE: FOLATE: 39.3 ng/mL (ref >5.9)

## 2017-01-13 LAB — IRON AND TIBC
IRON: 60 ug/dL (ref 28–170)
SATURATION RATIOS: 20 % (ref 10.4–31.8)
TIBC: 305 ug/dL (ref 250–450)
UIBC: 245 ug/dL

## 2017-01-13 LAB — CBC
HEMATOCRIT: 23.6 % — AB (ref 36.0–46.0)
HEMOGLOBIN: 7.8 g/dL — AB (ref 12.0–15.0)
MCH: 32.8 pg (ref 26.0–34.0)
MCHC: 33.1 g/dL (ref 30.0–36.0)
MCV: 99.2 fL (ref 78.0–100.0)
Platelets: 311 10*3/uL (ref 150–400)
RBC: 2.38 MIL/uL — AB (ref 3.87–5.11)
RDW: 14.2 % (ref 11.5–15.5)
WBC: 5.9 10*3/uL (ref 4.0–10.5)

## 2017-01-13 LAB — BASIC METABOLIC PANEL
Anion gap: 10 (ref 5–15)
BUN: 38 mg/dL — ABNORMAL HIGH (ref 6–20)
CHLORIDE: 107 mmol/L (ref 101–111)
CO2: 25 mmol/L (ref 22–32)
CREATININE: 2.12 mg/dL — AB (ref 0.44–1.00)
Calcium: 9.1 mg/dL (ref 8.9–10.3)
GFR calc non Af Amer: 18 mL/min — ABNORMAL LOW (ref >60)
GFR, EST AFRICAN AMERICAN: 21 mL/min — AB (ref >60)
Glucose, Bld: 102 mg/dL — ABNORMAL HIGH (ref 65–99)
POTASSIUM: 4.4 mmol/L (ref 3.5–5.1)
SODIUM: 142 mmol/L (ref 135–145)

## 2017-01-13 LAB — FERRITIN: Ferritin: 34 ng/mL (ref 11–307)

## 2017-01-13 LAB — PREPARE RBC (CROSSMATCH)

## 2017-01-13 LAB — VITAMIN B12: VITAMIN B 12: 636 pg/mL (ref 180–914)

## 2017-01-13 LAB — TSH: TSH: 8.01 u[IU]/mL — ABNORMAL HIGH (ref 0.350–4.500)

## 2017-01-13 MED ORDER — SODIUM CHLORIDE 0.9 % IV SOLN: Freq: Once | INTRAVENOUS | Status: DC

## 2017-01-13 NOTE — Progress Notes (Signed)
PROGRESS NOTE    Amanda Prince  [id number] DOB: 10/02/1917 DOA: 01/12/2017 PCP: Clinton Quant, MD    Brief Narrative: 83 yo patient lives at home, with basal cell CA on the right temporal area excised, admitted for weakness, lightheadedness, bleeding from the site, and had a syncopal episode.  She was quite SOB, and weak this am trying to walk to the BR.  Her Hb 12 days ago was 10.4 g per dL, and this morning, it was 7.8 g per dL.     Assessment & Plan:   Principal Problem:   Syncope Active Problems:   Hypothyroidism   HTN (hypertension)   Postoperative anemia due to acute blood loss   CKD (chronic kidney disease) stage 4, GFR 15-29 ml/min (HCC)   Systolic ejection murmur   Syncope:  Clearly from blood loss s/p BCC excision on the right temporal lobe.  Will cancel her ECHO.  After speaking with her and her family, will transfuse her 1 unit of PRBC today.  If she feels well tomorrow, will d/c her home.  HTN:  Stable. Hypothyroidism:  Will check TSH.  Contiue with supplement.  CKD:  Cr si 2.0, at baseline.   DVT prophylaxis: SCD.  Code Status: DNR.  Family Communication: 3 sisters at her bedside.  Disposition Plan: To home when better.   Consultants:   None.   Procedures:   None.   Antimicrobials: Anti-infectives    None       Subjective   Feeling weak and SOB>    Objective: Vitals:   01/12/17 2230 01/13/17 0230 01/13/17 0630 01/13/17 1453  BP: (!) 134/48 (!) 140/50 (!) 171/50 (!) 112/45  Pulse: 94 92 97 99  Resp: 18 18 18 20   Temp: 98.5 F (36.9 C) 98.4 F (36.9 C) 97.9 F (36.6 C) 98.3 F (36.8 C)  TempSrc: Oral Oral Oral Oral  SpO2: 96% 96% 97% 98%  Weight:   60.1 kg (132 lb 9.6 oz)   Height:        Intake/Output Summary (Last 24 hours) at 01/13/17 1625 Last data filed at 01/13/17 1300  Gross per 24 hour  Intake              626 ml  Output              600 ml  Net               26 ml   Filed Weights   01/12/17 0537 01/12/17 1734  01/13/17 0630  Weight: 59 kg (130 lb) 59 kg (130 lb) 60.1 kg (132 lb 9.6 oz)    Examination:  General exam: Appears calm and comfortable  Respiratory system: Clear to auscultation. Respiratory effort normal. Cardiovascular system: S1 & S2 heard, RRR. No JVD, murmurs, rubs, gallops or clicks. No pedal edema. Gastrointestinal system: Abdomen is nondistended, soft and nontender. No organomegaly or masses felt. Normal bowel sounds heard. Central nervous system: Alert and oriented. No focal neurological deficits. Extremities: Symmetric 5 x 5 power. Skin: No rashes, lesions or ulcers Psychiatry: Judgement and insight appear normal. Mood & affect appropriate.   Data Reviewed: I have personally reviewed following labs and imaging studies  CBC:  Recent Labs Lab 01/12/17 0834 01/13/17 0503  WBC 10.3 5.9  NEUTROABS 8.1*  --   HGB 8.5* 7.8*  HCT 25.5* 23.6*  MCV 98.5 99.2  PLT 325 [id number]   Basic Metabolic Panel:  Recent Labs Lab 01/12/17 0829 01/12/17 1327 01/13/17 0503  NA  138  --  142  K 5.1  --  4.4  CL 107  --  107  CO2 25  --  25  GLUCOSE 107*  --  102*  BUN 42*  --  38*  CREATININE 2.36*  --  2.12*  CALCIUM 8.9  --  9.1  MG  --  1.9  --    GFR: Estimated Creatinine Clearance: 13.6 mL/min (by C-G formula based on SCr of 2.12 mg/dL (H)). Liver Function Tests:  Recent Labs Lab 01/12/17 1327  AST 26  ALT 17  ALKPHOS 57  BILITOT 0.4  PROT 6.4*  ALBUMIN 3.4*   Coagulation Profile:  Recent Labs Lab 01/12/17 0834  INR 1.03   Cardiac Enzymes:  Recent Labs Lab 01/12/17 0829 01/12/17 1327 01/12/17 1909  TROPONINI <0.03 <0.03 <0.03   Anemia Panel:  Recent Labs  01/13/17 0503  VITAMINB12 636  FERRITIN 34  TIBC 305  IRON 60  RETICCTPCT 2.7    Radiology Studies: Dg Chest 2 View  Result Date: 01/12/2017 CLINICAL DATA:  Hypertension EXAM: CHEST  2 VIEW COMPARISON:  December 07, 2015 FINDINGS: There is eventration of the right hemidiaphragm, stable.  There is mild fibrotic change in lung bases. There is no frank edema or consolidation. Heart size and pulmonary vascularity are normal. There is atherosclerotic calcification in the aorta. No focal bone lesions. No evident adenopathy. IMPRESSION: Fibrotic change in lung bases, stable. No edema or consolidation. Stable right hemidiaphragm eventration. Aortic atherosclerosis. Electronically Signed   By: Lowella Grip III M.D.   On: 01/12/2017 08:34   Ct Head Wo Contrast  Result Date: 01/12/2017 CLINICAL DATA:  Fall with syncope and facial droop. Scalp bleeding from recent biopsy slight EXAM: CT HEAD WITHOUT CONTRAST TECHNIQUE: Contiguous axial images were obtained from the base of the skull through the vertex without intravenous contrast. COMPARISON:  December 07, 2015 FINDINGS: Brain: There is mild diffuse atrophy. There is no intracranial mass hemorrhage, extra-axial fluid collection, or midline shift. There is patchy small vessel disease in the centra semiovale bilaterally. Gray-white compartments elsewhere are normal. No acute infarct evident. Vascular: No hyperdense vessel. There is calcification in both carotid siphon regions. There is also calcification in distal vertebral arteries at the skullbase level. Skull: A bandage overlies the right frontal scalp superiorly. Bony calvarium appears intact. Sinuses/Orbits: There is mucosal thickening in several ethmoid air cells bilaterally. There is mucosal thickening in the superior left sphenoid sinus region. Other visualized paranasal sinuses are clear. Visualized orbits appear symmetric bilaterally. Other: There is opacification in several inferior mastoid air cells on the left, stable. Mastoids elsewhere bilaterally are clear. IMPRESSION: Stable atrophy with periventricular small vessel disease. No intracranial mass, hemorrhage, or extra-axial fluid collection. No acute infarct. Multiple foci of arterial vascular calcification. Areas of paranasal sinus  disease. Mild inferior left mastoid air cell disease, stable. Bandage overlying the right frontal scalp region. Electronically Signed   By: Lowella Grip III M.D.   On: 01/12/2017 13:37   Dg Humerus Left  Result Date: 01/12/2017 CLINICAL DATA:  Pain EXAM: LEFT HUMERUS - 2+ VIEW COMPARISON:  None. FINDINGS: Frontal and lateral views were obtained. No fracture or dislocation. The joint spaces appear unremarkable. No abnormal periosteal reaction. IMPRESSION: No fracture or dislocation.  No abnormal periosteal reaction. Electronically Signed   By: Lowella Grip III M.D.   On: 01/12/2017 08:35    Scheduled Meds: . sodium chloride   Intravenous Once  . donepezil  10 mg Oral Daily  .  levothyroxine  88 mcg Oral QAC breakfast  . loratadine  10 mg Oral Daily  . sodium chloride flush  3 mL Intravenous Q12H   Continuous Infusions:   LOS: 0 days   Revin Corker, MD FACP Hospitalist.   If 7PM-7AM, please contact night-coverage [email or web address] Password [facility] 01/13/2017, 4:25 PM

## 2017-01-13 NOTE — Clinical Social Work Note (Signed)
CSW received consult with no stated reason. Reviewed chart and spoke to RN and MD. No CSW needs reported at this time. MD will reconsult if needed. CSW signing off.   Benay Pike, [place]

## 2017-01-13 NOTE — Care Management Obs Status (Signed)
[place] [facility] NOTIFICATION   Patient Details  Name: QUASIA KETCHERSIDE MRN: [id number] Date of Birth: 10/02/1917   Medicare Observation Status Notification Given:  Yes    Sherald Barge, RN 01/13/2017, 1:07 PM

## 2017-01-14 DIAGNOSIS — I10 Essential (primary) hypertension: Secondary | ICD-10-CM

## 2017-01-14 DIAGNOSIS — L7622 Postprocedural hemorrhage and hematoma of skin and subcutaneous tissue following other procedure: Secondary | ICD-10-CM | POA: Diagnosis not present

## 2017-01-14 DIAGNOSIS — D62 Acute posthemorrhagic anemia: Secondary | ICD-10-CM | POA: Diagnosis not present

## 2017-01-14 LAB — CBC
HCT: 30.7 % — ABNORMAL LOW (ref 36.0–46.0)
Hemoglobin: 10.4 g/dL — ABNORMAL LOW (ref 12.0–15.0)
MCH: 32.3 pg (ref 26.0–34.0)
MCHC: 33.9 g/dL (ref 30.0–36.0)
MCV: 95.3 fL (ref 78.0–100.0)
PLATELETS: 318 10*3/uL (ref 150–400)
RBC: 3.22 MIL/uL — AB (ref 3.87–5.11)
RDW: 15.8 % — ABNORMAL HIGH (ref 11.5–15.5)
WBC: 8.4 10*3/uL (ref 4.0–10.5)

## 2017-01-14 LAB — TYPE AND SCREEN
ABO/RH(D): O POS
Antibody Screen: NEGATIVE
Unit division: 0

## 2017-01-14 LAB — HEMOGLOBIN AND HEMATOCRIT, BLOOD
HCT: 29.1 % — ABNORMAL LOW (ref 36.0–46.0)
Hemoglobin: 9.8 g/dL — ABNORMAL LOW (ref 12.0–15.0)

## 2017-01-14 MED ORDER — SYNTHROID 100 MCG PO TABS: 100.0000 ug | ORAL_TABLET | Freq: Every day | ORAL | 1 refills | Status: DC

## 2017-01-14 NOTE — Progress Notes (Signed)
Patient discharged with instructions, prescription, and care notes.  Verbalized understanding via teach back.  IV was removed and the site was WNL. Patient voiced no further complaints or concerns at the time of discharge.  Appointments scheduled per instructions.  Patient left the floor via w/c family  And staff in stable condition.   Mrs. Amanda Prince the patients daughter called me to clarify the patients new synthroid prescription.  After speaking with Dr. Marin Comment I voiced to her that the MD stated that the dose was changed to 100 mcg everyday versus the alternating doses of 88 mcg and 100 mcg.  [name] verbalized understanding.

## 2017-01-14 NOTE — Discharge Summary (Signed)
Physician Discharge Summary  Amanda Prince [id number] DOB: 10/02/1917 DOA: 01/12/2017  PCP: Clinton Quant, MD  Admit date: 01/12/2017 Discharge date: 01/14/2017  Admitted From: Home. Disposition:  Home.   Recommendations for Outpatient Follow-up:  1. Follow up with PCP in 1-2 weeks 2. Follow up with dermatologist as scheduled.   Home Health: None.  Equipment/Devices:none.  Discharge Condition: no DOE.  Able to ambulate quite well.   Hb of 10g per dL.  CODE STATUS:FULL CODE.  Diet recommendation: as tolerated.   Brief/Interim Summary:  Patient was admitted for weakness, syncope, and chronic bleeding from the [facility] of the temporal area by Dr Olevia Bowens on Jan 12, 2017.  As per his H and P:  "  HPI: Amanda Prince is a 83 y.o. female with medical history significant of anxiety, osteoarthritis, hypertension, urolithiasis, seasonal allergies, hypothyroidism, vertigo, basal cell skin cancer of the scalp and face who recently underwent treatment for removal of this lesion on February 5, came to the ED on February 7, has surgery so placed in the wounds for bleeding and now returns to the ER after having persistent bleeding from her right temple area wound this morning.  Per patient's daughters, the lesions had been bleeding since last night. The bleeding has been significant enough to soak some dressings. Earlier in the morning, the patient woke up and went to the bathroom, was unable to get out from the toilet. Her daughters subsequently called the neighbor and EMS, who once on scene apparently witnessed the patient syncopized and have LOC briefly and a facial droop. The patient denies chest pain, palpitations, dizziness, diaphoresis, dyspnea, PND or orthopnea.  ED Course: Workup in the emergency department showed EKG is sinus rhythm with nonspecific T-wave abnormality in anterolateral leads. Her hemoglobin level has decreased to 8.5 today from 10.3 g/dL 11 days ago. WBC 10.3 and platelets 325. PT  and INR within normal limits. Troponin level was negative. Sodium 138, potassium 5.1, chloride 107, bicarbonate 25 mmol/L. BUN was 42, previously 44, creatinine is 2.36 today and 11 days ago was 2.06. Her glucose level was 107. Total protein was 6.4 and albumin 3.4 g/dL, otherwise her LFTs are within normal range.  Imaging: Her chest radiograph showed stable fibrotic changes at the bases, stable right hemidiaphragm eventration and aortic atherosclerosis. A CT scan of the head shows stable atrophy, small vessel disease and some areas of arterial vascular calcification  Review of Systems: As per HPI otherwise 10 point review of systems negative.   HOSPITAL COURSE:  Patient had slow bleeding from her right temporal area after a basal cell carcinoma excision by her dermatologist.  Her Hb actually dropped down to 7.8 g per dL, from 10.4 g per dL 12 days prior.  She was given IVF, and she was weak with DOE.  Since ECHO would not be helpful in managing her, it was cancelled.  After discussing the risk and benefit of blood transfusion to her and her family, she would like to receive blood.  She was given 1 unit of PRBC, and her Hb rose to 10 g per dL.  She felt much stronger, and was able to ambulate without any DOE.  Her bleeding site has stopped as well.  Her Cr was elevated to 2.1, but this is not new for her, as she has CKD 4.  Due to her hx of hypothyoridism, I checked her TSH, and it was elevated to 8.  Her daughter said she has been giving her meds, that  she has been very compliant.  The current regimen is 81mcg/100 mcg QOD.  I will therefore increase her dose to 174mcg per day.  She will follow up with her PCP next week and her dermatologist after that.  She will need to have another TSH in about 4-6 weeks.  Thank you and Good Day.   Discharge Diagnoses:  Principal Problem:   Syncope Active Problems:   Hypothyroidism   HTN (hypertension)   Postoperative anemia due to acute blood loss   CKD (chronic  kidney disease) stage 4, GFR 15-29 ml/min (HCC)   Systolic ejection murmur    Discharge Instructions  Discharge Instructions    Diet - low sodium heart healthy    Complete by:  As directed    Discharge instructions    Complete by:  As directed    Follow up with your PCP next week.   Increase activity slowly    Complete by:  As directed      Allergies as of 01/14/2017      Reactions   Other Shortness Of Breath   Spray the dermatology used recently    Sulfa Antibiotics       Medication List    STOP taking these medications   furosemide 20 MG tablet Commonly known as:  LASIX   loratadine 10 MG tablet Commonly known as:  CLARITIN   meclizine 32 MG tablet Commonly known as:  ANTIVERT     TAKE these medications   artificial tears ointment Place 1 drop into both eyes as needed (Dry eye).   donepezil 10 MG tablet Commonly known as:  ARICEPT Take 1 tablet by mouth daily.   LORazepam 0.5 MG tablet Commonly known as:  ATIVAN Take 1 tablet by mouth daily.   metoprolol tartrate 25 MG tablet Commonly known as:  LOPRESSOR Take 1 tablet (25 mg total) by mouth 2 (two) times daily.   SYNTHROID 100 MCG tablet Generic drug:  levothyroxine Take 1 tablet (100 mcg total) by mouth daily. Alternate 88-100 mcg every other day What changed:  how much to take   VITAMIN B12 PO Take 1 tablet by mouth daily.   VITAMIN B6 PO Take 1 tablet by mouth daily.      Follow-up Information    POMPOSINI,DANIEL L, MD. Schedule an appointment as soon as possible for a visit in 2 days.   Specialty:  Internal Medicine         Allergies  Allergen Reactions  . Other Shortness Of Breath    Spray the dermatology used recently   . Sulfa Antibiotics     Consultations:  None.    Procedures/Studies: Dg Chest 2 View  Result Date: 01/12/2017 CLINICAL DATA:  Hypertension EXAM: CHEST  2 VIEW COMPARISON:  December 07, 2015 FINDINGS: There is eventration of the right hemidiaphragm, stable.  There is mild fibrotic change in lung bases. There is no frank edema or consolidation. Heart size and pulmonary vascularity are normal. There is atherosclerotic calcification in the aorta. No focal bone lesions. No evident adenopathy. IMPRESSION: Fibrotic change in lung bases, stable. No edema or consolidation. Stable right hemidiaphragm eventration. Aortic atherosclerosis. Electronically Signed   By: Lowella Grip III M.D.   On: 01/12/2017 08:34   Ct Head Wo Contrast  Result Date: 01/12/2017 CLINICAL DATA:  Fall with syncope and facial droop. Scalp bleeding from recent biopsy slight EXAM: CT HEAD WITHOUT CONTRAST TECHNIQUE: Contiguous axial images were obtained from the base of the skull through the vertex without intravenous  contrast. COMPARISON:  December 07, 2015 FINDINGS: Brain: There is mild diffuse atrophy. There is no intracranial mass hemorrhage, extra-axial fluid collection, or midline shift. There is patchy small vessel disease in the centra semiovale bilaterally. Gray-white compartments elsewhere are normal. No acute infarct evident. Vascular: No hyperdense vessel. There is calcification in both carotid siphon regions. There is also calcification in distal vertebral arteries at the skullbase level. Skull: A bandage overlies the right frontal scalp superiorly. Bony calvarium appears intact. Sinuses/Orbits: There is mucosal thickening in several ethmoid air cells bilaterally. There is mucosal thickening in the superior left sphenoid sinus region. Other visualized paranasal sinuses are clear. Visualized orbits appear symmetric bilaterally. Other: There is opacification in several inferior mastoid air cells on the left, stable. Mastoids elsewhere bilaterally are clear. IMPRESSION: Stable atrophy with periventricular small vessel disease. No intracranial mass, hemorrhage, or extra-axial fluid collection. No acute infarct. Multiple foci of arterial vascular calcification. Areas of paranasal sinus  disease. Mild inferior left mastoid air cell disease, stable. Bandage overlying the right frontal scalp region. Electronically Signed   By: Lowella Grip III M.D.   On: 01/12/2017 13:37   Dg Humerus Left  Result Date: 01/12/2017 CLINICAL DATA:  Pain EXAM: LEFT HUMERUS - 2+ VIEW COMPARISON:  None. FINDINGS: Frontal and lateral views were obtained. No fracture or dislocation. The joint spaces appear unremarkable. No abnormal periosteal reaction. IMPRESSION: No fracture or dislocation.  No abnormal periosteal reaction. Electronically Signed   By: Lowella Grip III M.D.   On: 01/12/2017 08:35       Subjective: Feels well.    Discharge Exam: Vitals:   01/14/17 0400 01/14/17 0622  BP: (!) 158/54 (!) 158/63  Pulse: (!) 105 99  Resp: 20 18  Temp: 98.4 F (36.9 C) 98.7 F (37.1 C)   Vitals:   01/14/17 0042 01/14/17 0400 01/14/17 0500 01/14/17 0622  BP: (!) 145/48 (!) 158/54  (!) 158/63  Pulse: (!) 101 (!) 105  99  Resp: 20 20  18   Temp: 98.3 F (36.8 C) 98.4 F (36.9 C)  98.7 F (37.1 C)  TempSrc: Oral Oral  Oral  SpO2: 97% 96%  97%  Weight:   59.4 kg (130 lb 15.3 oz)   Height:        General: Pt is alert, awake, not in acute distress Cardiovascular: RRR, S1/S2 +, no rubs, no gallops Respiratory: CTA bilaterally, no wheezing, no rhonchi Abdominal: Soft, NT, ND, bowel sounds + Extremities: no edema, no cyanosis    The results of significant diagnostics from this hospitalization (including imaging, microbiology, ancillary and laboratory) are listed below for reference.     Basic Metabolic Panel:  Recent Labs Lab 01/12/17 0829 01/12/17 1327 01/13/17 0503  NA 138  --  142  K 5.1  --  4.4  CL 107  --  107  CO2 25  --  25  GLUCOSE 107*  --  102*  BUN 42*  --  38*  CREATININE 2.36*  --  2.12*  CALCIUM 8.9  --  9.1  MG  --  1.9  --    Liver Function Tests:  Recent Labs Lab 01/12/17 1327  AST 26  ALT 17  ALKPHOS 57  BILITOT 0.4  PROT 6.4*  ALBUMIN  3.4*   CBC:  Recent Labs Lab 01/12/17 0834 01/13/17 0503 01/14/17 0107 01/14/17 0434  WBC 10.3 5.9  --  8.4  NEUTROABS 8.1*  --   --   --   HGB 8.5* 7.8*  9.8* 10.4*  HCT 25.5* 23.6* 29.1* 30.7*  MCV 98.5 99.2  --  95.3  PLT 325 311  --  318   Cardiac Enzymes:  Recent Labs Lab 01/12/17 0829 01/12/17 1327 01/12/17 1909  TROPONINI <0.03 <0.03 <0.03   Thyroid function studies  Recent Labs  01/13/17 1646  TSH 8.010*   Anemia work up  Recent Labs  01/13/17 0503  VITAMINB12 636  FOLATE 39.3  FERRITIN 34  TIBC 305  IRON 60  RETICCTPCT 2.7   Urinalysis    Component Value Date/Time   COLORURINE YELLOW 12/07/2015 1145   APPEARANCEUR HAZY (A) 12/07/2015 1145   LABSPEC 1.010 12/07/2015 1145   PHURINE 5.5 12/07/2015 1145   GLUCOSEU NEGATIVE 12/07/2015 1145   HGBUR NEGATIVE 12/07/2015 1145   BILIRUBINUR NEGATIVE 12/07/2015 1145   KETONESUR NEGATIVE 12/07/2015 1145   PROTEINUR NEGATIVE 12/07/2015 1145   UROBILINOGEN 0.2 07/18/2015 2232   NITRITE NEGATIVE 12/07/2015 1145   LEUKOCYTESUR LARGE (A) 12/07/2015 1145    Time coordinating discharge: Over 30 minutes SIGNED:   Orvan Falconer, MD FACP [facility] 01/14/2017, 11:19 AM   If 7PM-7AM, please contact night-coverage [email or web address] Password [email or web address]

## 2017-01-14 NOTE — Care Management Note (Signed)
Case Management Note  Patient Details  Name: Amanda Prince MRN: [id number] Date of Birth: 10/02/1917  Subjective/Objective:                  Pt admitted with bleeding. She is from home, lives in her own home has have 3 daughters who care for her 24/7. Pt ambulates ind with walker. Pt plans on pt returning home with self/family care. No DME or HH needed at this time.   Action/Plan: Pt discharging home today. No CM needs.   Expected Discharge Date:  01/14/17               Expected Discharge Plan:  Home/Self Care  In-House Referral:  NA  Discharge planning Services  CM Consult  Post Acute Care Choice:  NA Choice offered to:  NA  Status of Service:  Completed, signed off  Sherald Barge, RN 01/14/2017, 11:25 AM

## 2017-01-16 ENCOUNTER — Encounter: Payer: Self-pay | Admitting: *Deleted

## 2017-01-22 ENCOUNTER — Encounter: Payer: Self-pay | Admitting: General Surgery

## 2017-01-22 ENCOUNTER — Ambulatory Visit (INDEPENDENT_AMBULATORY_CARE_PROVIDER_SITE_OTHER): Payer: Medicare Other | Admitting: General Surgery

## 2017-01-22 VITALS — BP 132/68 | HR 76 | Resp 18 | Ht 60.0 in | Wt 138.0 lb

## 2017-01-22 DIAGNOSIS — C44310 Basal cell carcinoma of skin of unspecified parts of face: Secondary | ICD-10-CM

## 2017-01-22 NOTE — Patient Instructions (Addendum)
Patient to return 2 days.

## 2017-01-22 NOTE — Progress Notes (Signed)
Patient ID: Amanda Prince, female   DOB: 10/02/1917, 83 y.o.   MRN: [id number]  Chief Complaint  Patient presents with  . Other    HPI Amanda Prince is a 83 y.o. female here today for a evaluation of a basal cell cancer on right side of head. Patient states she noticed this area about a month ago. Removal of basal cell carcinoma on 12/30/2016.  Patient was seen in the ED on 12/31/2016 for this area on her head which was bleeding.On 01/12/2017 she passed out, as she had another large episode bleed from the surgical site. Patient was admitted at [facility] for two days and received one unit of blood. (Nadir HGB: 7.5).  No recurrent episodes of dyspnea on exertion as noted prior to transfusion. The wound was dressed when she was admitted to the hospital and this dressing has remained in place for the past week. The patient's daughters report that she eats very little, but they are not aware of any weight loss. The patient herself reports that she's not a big eater and really doesn't have much of an appetite. She does drink 2 servings of Carnation Instant Breakfast daily plus some solid foods. She doesn't report any postprandial pain or early satiety.  Daughter [name] and [name] present at visit.  HPI  Past Medical History:  Diagnosis Date  . Anxiety   . Arthritis   . Cancer ([place])    basal cell skin cancer  . HTN (hypertension)   . Kidney stone   . Seasonal allergies   . Thyroid disease   . Vertigo     Past Surgical History:  Procedure Laterality Date  . GALLBLADDER SURGERY    . kidney stones    . ORIF HIP FRACTURE Right 07/19/2015   Procedure: OPEN REDUCTION INTERNAL FIXATION HIP;  Surgeon: Sanjuana Kava, MD;  Location: [facility];  Service: Orthopedics;  Laterality: Right;  . otif right wrist  Dr Aline Brochure 2012  . WRIST FRACTURE SURGERY      Family History  Problem Relation Age of Onset  . Heart disease      Social History Social History  Substance Use Topics  . Smoking  status: Never Smoker  . Smokeless tobacco: Never Used  . Alcohol use No    Allergies  Allergen Reactions  . Other Shortness Of Breath    Spray the dermatology used recently   . Sulfa Antibiotics     Current Outpatient Prescriptions  Medication Sig Dispense Refill  . Artificial Tear Ointment (ARTIFICIAL TEARS) ointment Place 1 drop into both eyes as needed (Dry eye).    . Cholecalciferol (D3-1000 PO) Take by mouth.    . Cyanocobalamin (VITAMIN B12 PO) Take 1 tablet by mouth daily.     Marland Kitchen donepezil (ARICEPT) 10 MG tablet Take 1 tablet by mouth daily.    Marland Kitchen LORazepam (ATIVAN) 0.5 MG tablet Take 1 tablet by mouth daily.    . Melatonin-Pyridoxine (MELATONEX PO) Take by mouth.    . metoprolol tartrate (LOPRESSOR) 25 MG tablet Take 1 tablet (25 mg total) by mouth 2 (two) times daily. 60 tablet 1  . Omega-3 Fatty Acids (FISH OIL) 1000 MG CAPS Take by mouth.    . Pyridoxine HCl (VITAMIN B6 PO) Take 1 tablet by mouth daily.     . Stavudine (ZERIT) 1 MG/ML SOLR Take by mouth.    . SYNTHROID 100 MCG tablet Take 1 tablet (100 mcg total) by mouth daily. Alternate 88-100 mcg every other day  30 tablet 1   No current facility-administered medications for this visit.     Review of Systems Review of Systems  Constitutional: Negative.   Respiratory: Negative.   Cardiovascular: Negative.     Blood pressure 132/68, pulse 76, resp. rate 18, height 5' (1.524 m), weight 138 lb (62.6 kg).  Physical Exam Physical Exam  HENT:  Head:    Lymphadenopathy:    She has no cervical adenopathy.       Right cervical: No superficial cervical adenopathy present.      Left cervical: No superficial cervical adenopathy present.  Neurological: She is alert.    Data Reviewed Letter from Deatra Canter, MD dated yesterday reviewed. Phone conversation today.  Office notes from February 5 through February 22 reviewed.  Pathology of the right sideburn area showed a basal cell carcinoma, nodular pattern. No  mention of involvement of the deep margin. Incidentally, superficial basal cell carcinoma from the right anterior vertex did show the base to be involved.  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    Plan    The pathology doesn't show obvious involvement of the deep margins, although it certainly a possibility. At this age, I think the goal is to get this wound to heal and then follow it clinically. Basal cell carcinomas tend to recur locally if not completely excised rather than with metastatic spread and that observation is not unreasonable.  The site was cleaned and the centimeter diameter area of necrotic tissue gently probed with no gross bleeding noted. I think this will go on to slough and heal by secondary intent.  Facility was applied followed by due to return dressing.  We'll reassess the patient in 2 days to see how she does with this dressing regimen menses main complaint of her bulky dressing before his that she could wear glasses) and proceed from there. I'll be reluctant to put her through a wide excision and then rotation flap coverage. Options were reviewed with the patient and her daughters.    Patient to return on Friday for office visit.  This information has been scribed by Gaspar Cola CMA.   Robert Bellow 01/22/2017, 11:07 AM

## 2017-01-23 ENCOUNTER — Ambulatory Visit (INDEPENDENT_AMBULATORY_CARE_PROVIDER_SITE_OTHER): Payer: Medicare Other | Admitting: General Surgery

## 2017-01-23 ENCOUNTER — Encounter: Payer: Self-pay | Admitting: General Surgery

## 2017-01-23 VITALS — BP 124/72 | HR 66 | Resp 16 | Ht 60.0 in | Wt 138.0 lb

## 2017-01-23 DIAGNOSIS — C44310 Basal cell carcinoma of skin of unspecified parts of face: Secondary | ICD-10-CM

## 2017-01-23 MED ORDER — SILVER SULFADIAZINE 1 % EX CREA: TOPICAL_CREAM | CUTANEOUS | 0 refills | Status: DC

## 2017-01-23 NOTE — Patient Instructions (Signed)
Use silvadene two times daily. Return next week.

## 2017-01-23 NOTE — Progress Notes (Signed)
Patient ID: Amanda Prince, female   DOB: 10/02/1917, 83 y.o.   MRN: [id number]  Chief Complaint  Patient presents with  . Follow-up    HPI Amanda Prince is a 83 y.o. female.  Here today for follow up dressing change.The original plan was to see her tomorrow but the daughter called to report thin brown drainage coming underneath the duodenum yesterday afternoon. HPI  Past Medical History:  Diagnosis Date  . Anxiety   . Arthritis   . Cancer ([place])    basal cell skin cancer  . HTN (hypertension)   . Kidney stone   . Seasonal allergies   . Thyroid disease   . Vertigo     Past Surgical History:  Procedure Laterality Date  . GALLBLADDER SURGERY    . kidney stones    . ORIF HIP FRACTURE Right 07/19/2015   Procedure: OPEN REDUCTION INTERNAL FIXATION HIP;  Surgeon: Sanjuana Kava, MD;  Location: [facility];  Service: Orthopedics;  Laterality: Right;  . otif right wrist  Dr Aline Brochure 2012  . WRIST FRACTURE SURGERY      Family History  Problem Relation Age of Onset  . Heart disease      Social History Social History  Substance Use Topics  . Smoking status: Never Smoker  . Smokeless tobacco: Never Used  . Alcohol use No    Allergies  Allergen Reactions  . Other Shortness Of Breath    Spray the dermatology used recently   . Sulfa Antibiotics     Current Outpatient Prescriptions  Medication Sig Dispense Refill  . Artificial Tear Ointment (ARTIFICIAL TEARS) ointment Place 1 drop into both eyes as needed (Dry eye).    . Cholecalciferol (D3-1000 PO) Take by mouth.    . Cyanocobalamin (VITAMIN B12 PO) Take 1 tablet by mouth daily.     Marland Kitchen donepezil (ARICEPT) 10 MG tablet Take 1 tablet by mouth daily.    Marland Kitchen LORazepam (ATIVAN) 0.5 MG tablet Take 1 tablet by mouth daily.    . Melatonin-Pyridoxine (MELATONEX PO) Take by mouth.    . metoprolol tartrate (LOPRESSOR) 25 MG tablet Take 1 tablet (25 mg total) by mouth 2 (two) times daily. 60 tablet 1  . Omega-3 Fatty Acids (FISH OIL) 1000 MG  CAPS Take by mouth.    . Pyridoxine HCl (VITAMIN B6 PO) Take 1 tablet by mouth daily.     . silver sulfADIAZINE (SILVADENE) 1 % cream Apply to affected area daily 50 g 0  . Stavudine (ZERIT) 1 MG/ML SOLR Take by mouth.    . SYNTHROID 100 MCG tablet Take 1 tablet (100 mcg total) by mouth daily. Alternate 88-100 mcg every other day 30 tablet 1   No current facility-administered medications for this visit.     Review of Systems Review of Systems  Constitutional: Negative.   Respiratory: Negative.   Cardiovascular: Negative.     Blood pressure 124/72, pulse 66, resp. rate 16, height 5' (1.524 m), weight 138 lb (62.6 kg).  Physical Exam Physical Exam  Constitutional: She is oriented to person, place, and time. She appears well-developed and well-nourished.  HENT:  Head:    Neurological: She is alert and oriented to person, place, and time.  Skin: Skin is warm and dry.  Psychiatric: Her behavior is normal.       Assessment    Excessive drainage for due dermis at this time.    Plan    Daughter was shown how to apply Silvadene cream to the area  and will keep it covered with a clean gauze, changing as necessary. Silvadene will be applied twice a day. Bathing is usual. Follow-up in 5 days.   This information has been scribed by Karie Fetch RN, BSN,BC.   Robert Bellow 01/23/2017, 4:00 PM

## 2017-01-24 ENCOUNTER — Ambulatory Visit: Payer: Medicare Other | Admitting: General Surgery

## 2017-01-28 ENCOUNTER — Ambulatory Visit (INDEPENDENT_AMBULATORY_CARE_PROVIDER_SITE_OTHER): Payer: Medicare Other | Admitting: General Surgery

## 2017-01-28 ENCOUNTER — Encounter: Payer: Self-pay | Admitting: General Surgery

## 2017-01-28 VITALS — BP 116/70 | HR 79 | Resp 14 | Ht 60.0 in | Wt 138.0 lb

## 2017-01-28 DIAGNOSIS — C44310 Basal cell carcinoma of skin of unspecified parts of face: Secondary | ICD-10-CM

## 2017-01-28 NOTE — Progress Notes (Signed)
Patient ID: Amanda Prince, female   DOB: 10/02/1917, 83 y.o.   MRN: [id number]  Chief Complaint  Patient presents with  . Follow-up    HPI Amanda Prince is a 83 y.o. female here today for a bandage change on right side of her face. Daughter Amanda Prince and Amanda Prince present at visit.  HPI  Past Medical History:  Diagnosis Date  . Anxiety   . Arthritis   . Cancer ([place])    basal cell skin cancer  . HTN (hypertension)   . Kidney stone   . Seasonal allergies   . Thyroid disease   . Vertigo     Past Surgical History:  Procedure Laterality Date  . GALLBLADDER SURGERY    . kidney stones    . ORIF HIP FRACTURE Right 07/19/2015   Procedure: OPEN REDUCTION INTERNAL FIXATION HIP;  Surgeon: Sanjuana Kava, MD;  Location: [facility];  Service: Orthopedics;  Laterality: Right;  . otif right wrist  Dr Aline Brochure 2012  . WRIST FRACTURE SURGERY      Family History  Problem Relation Age of Onset  . Heart disease      Social History Social History  Substance Use Topics  . Smoking status: Never Smoker  . Smokeless tobacco: Never Used  . Alcohol use No    Allergies  Allergen Reactions  . Other Shortness Of Breath    Spray the dermatology used recently   . Sulfa Antibiotics     Current Outpatient Prescriptions  Medication Sig Dispense Refill  . Artificial Tear Ointment (ARTIFICIAL TEARS) ointment Place 1 drop into both eyes as needed (Dry eye).    . Cholecalciferol (D3-1000 PO) Take by mouth.    . Cyanocobalamin (VITAMIN B12 PO) Take 1 tablet by mouth daily.     Marland Kitchen donepezil (ARICEPT) 10 MG tablet Take 1 tablet by mouth daily.    Marland Kitchen LORazepam (ATIVAN) 0.5 MG tablet Take 1 tablet by mouth daily.    . Melatonin-Pyridoxine (MELATONEX PO) Take by mouth.    . metoprolol tartrate (LOPRESSOR) 25 MG tablet Take 1 tablet (25 mg total) by mouth 2 (two) times daily. 60 tablet 1  . Omega-3 Fatty Acids (FISH OIL) 1000 MG CAPS Take by mouth.    . Pyridoxine HCl (VITAMIN B6 PO) Take 1 tablet by mouth daily.      . silver sulfADIAZINE (SILVADENE) 1 % cream Apply to affected area daily 50 g 0  . Stavudine (ZERIT) 1 MG/ML SOLR Take by mouth.    . SYNTHROID 100 MCG tablet Take 1 tablet (100 mcg total) by mouth daily. Alternate 88-100 mcg every other day 30 tablet 1   No current facility-administered medications for this visit.     Review of Systems Review of Systems  Constitutional: Negative.   Respiratory: Negative.   Cardiovascular: Negative.     Blood pressure 116/70, pulse 79, resp. rate 14, height 5' (1.524 m), weight 138 lb (62.6 kg).  Physical Exam Physical Exam  HENT:  Head:         Assessment    Steady improvement in right preauricular wound.    Plan       Silvadene cream to the area and will keep it covered with a clean gauze, changing as necessary. Silvadene will be applied twice a day. Patient to return in ten days   This information has been scribed by Gaspar Cola CMA.   Robert Bellow 01/29/2017, 2:56 PM

## 2017-01-28 NOTE — Patient Instructions (Addendum)
Silvadene cream to the area and will keep it covered with a clean gauze, changing as necessary. Silvadene will be applied twice a day. Patient to return in ten days.

## 2017-02-11 ENCOUNTER — Encounter: Payer: Self-pay | Admitting: General Surgery

## 2017-02-11 ENCOUNTER — Ambulatory Visit (INDEPENDENT_AMBULATORY_CARE_PROVIDER_SITE_OTHER): Payer: Medicare Other | Admitting: General Surgery

## 2017-02-11 VITALS — BP 112/74 | HR 68 | Resp 12 | Ht 60.0 in | Wt 128.0 lb

## 2017-02-11 DIAGNOSIS — C44310 Basal cell carcinoma of skin of unspecified parts of face: Secondary | ICD-10-CM | POA: Diagnosis not present

## 2017-02-11 NOTE — Progress Notes (Signed)
Patient ID: Amanda BURNSIDE, female   DOB: 10/02/1917, 83 y.o.   MRN: [id number]  Chief Complaint  Patient presents with  . Follow-up    HPI Amanda Prince is a 83 y.o. female here today for her follow up check excision. Triple antibiotic ointment on top of head and silvadene dream to right face.  She is here with her daughters [name] and [name].     HPI  Past Medical History:  Diagnosis Date  . Anxiety   . Arthritis   . Cancer ([place])    basal cell skin cancer  . HTN (hypertension)   . Kidney stone   . Seasonal allergies   . Thyroid disease   . Vertigo     Past Surgical History:  Procedure Laterality Date  . GALLBLADDER SURGERY    . kidney stones    . ORIF HIP FRACTURE Right 07/19/2015   Procedure: OPEN REDUCTION INTERNAL FIXATION HIP;  Surgeon: Sanjuana Kava, MD;  Location: [facility];  Service: Orthopedics;  Laterality: Right;  . otif right wrist  Dr Aline Brochure 2012  . WRIST FRACTURE SURGERY      Family History  Problem Relation Age of Onset  . Heart disease      Social History Social History  Substance Use Topics  . Smoking status: Never Smoker  . Smokeless tobacco: Never Used  . Alcohol use No    Allergies  Allergen Reactions  . Other Shortness Of Breath    Spray the dermatology used recently   . Sulfa Antibiotics     Current Outpatient Prescriptions  Medication Sig Dispense Refill  . Artificial Tear Ointment (ARTIFICIAL TEARS) ointment Place 1 drop into both eyes as needed (Dry eye).    . Cholecalciferol (D3-1000 PO) Take by mouth.    . Cyanocobalamin (VITAMIN B12 PO) Take 1 tablet by mouth daily.     Marland Kitchen donepezil (ARICEPT) 10 MG tablet Take 1 tablet by mouth daily.    Marland Kitchen LORazepam (ATIVAN) 0.5 MG tablet Take 1 tablet by mouth daily.    . Melatonin-Pyridoxine (MELATONEX PO) Take by mouth.    . metoprolol tartrate (LOPRESSOR) 25 MG tablet Take 1 tablet (25 mg total) by mouth 2 (two) times daily. 60 tablet 1  . Omega-3 Fatty Acids (FISH OIL) 1000 MG CAPS Take by  mouth.    . Pyridoxine HCl (VITAMIN B6 PO) Take 1 tablet by mouth daily.     . silver sulfADIAZINE (SILVADENE) 1 % cream Apply to affected area daily 50 g 0  . Stavudine (ZERIT) 1 MG/ML SOLR Take by mouth.    . SYNTHROID 100 MCG tablet Take 1 tablet (100 mcg total) by mouth daily. Alternate 88-100 mcg every other day 30 tablet 1   No current facility-administered medications for this visit.     Review of Systems Review of Systems  Constitutional: Negative.   Respiratory: Negative.   Cardiovascular: Negative.    6 by 7 cm  Blood pressure 112/74, pulse 68, resp. rate 12, height 5' (1.524 m), weight 128 lb (58.1 kg).  Physical Exam Physical Exam  HENT:  Head:      The right preauricular lesion is healing very nicely, markedly decreased in size.   Scalp lesions are clean without evidence of infection.    Assessment    Steady improvement in biopsy sites.    Plan       Patient to return in three weeks.  Patient to use an triple antibiotic ointment on both the preauricular and scalp  lesions. Silvadene Be discontinued.  This information has been scribed by Gaspar Cola CMA.   Robert Bellow 02/12/2017, 8:09 PM

## 2017-02-11 NOTE — Patient Instructions (Signed)
Patient to return in three weeks.  Patient to use an triple antibiotic ointment.

## 2017-03-05 ENCOUNTER — Ambulatory Visit (INDEPENDENT_AMBULATORY_CARE_PROVIDER_SITE_OTHER): Payer: Medicare Other | Admitting: General Surgery

## 2017-03-05 ENCOUNTER — Encounter: Payer: Self-pay | Admitting: General Surgery

## 2017-03-05 VITALS — BP 122/68 | HR 74 | Resp 14 | Ht 60.0 in | Wt 128.0 lb

## 2017-03-05 DIAGNOSIS — C44310 Basal cell carcinoma of skin of unspecified parts of face: Secondary | ICD-10-CM

## 2017-03-05 NOTE — Progress Notes (Signed)
Patient ID: Amanda Prince, female   DOB: 10/02/1917, 83 y.o.   MRN: [id number]  Chief Complaint  Patient presents with  . Follow-up    HPI Amanda Prince is a 83 y.o. female. Her today for follow up basal cell excision cheek. They are using Ameragel to the areas. The daughters states she is a little more confused lately. She is here with her daughters Amanda Prince and Amanda Prince.  HPI  Past Medical History:  Diagnosis Date  . Anxiety   . Arthritis   . Cancer ([place])    basal cell skin cancer  . HTN (hypertension)   . Kidney stone   . Seasonal allergies   . Thyroid disease   . Vertigo     Past Surgical History:  Procedure Laterality Date  . GALLBLADDER SURGERY    . kidney stones    . ORIF HIP FRACTURE Right 07/19/2015   Procedure: OPEN REDUCTION INTERNAL FIXATION HIP;  Surgeon: Sanjuana Kava, MD;  Location: [facility];  Service: Orthopedics;  Laterality: Right;  . otif right wrist  Dr Aline Brochure 2012  . WRIST FRACTURE SURGERY      Family History  Problem Relation Age of Onset  . Heart disease      Social History Social History  Substance Use Topics  . Smoking status: Never Smoker  . Smokeless tobacco: Never Used  . Alcohol use No    Allergies  Allergen Reactions  . Other Shortness Of Breath    Spray the dermatology used recently   . Sulfa Antibiotics     Current Outpatient Prescriptions  Medication Sig Dispense Refill  . Artificial Tear Ointment (ARTIFICIAL TEARS) ointment Place 1 drop into both eyes as needed (Dry eye).    . Cholecalciferol (D3-1000 PO) Take by mouth.    . Cyanocobalamin (VITAMIN B12 PO) Take 1 tablet by mouth daily.     Marland Kitchen donepezil (ARICEPT) 10 MG tablet Take 1 tablet by mouth daily.    Marland Kitchen LORazepam (ATIVAN) 0.5 MG tablet Take 1 tablet by mouth daily.    . Melatonin-Pyridoxine (MELATONEX PO) Take by mouth.    . metoprolol tartrate (LOPRESSOR) 25 MG tablet Take 1 tablet (25 mg total) by mouth 2 (two) times daily. 60 tablet 1  . Omega-3 Fatty Acids (FISH OIL)  1000 MG CAPS Take by mouth.    . Pyridoxine HCl (VITAMIN B6 PO) Take 1 tablet by mouth daily.     . Stavudine (ZERIT) 1 MG/ML SOLR Take by mouth.    . SYNTHROID 100 MCG tablet Take 1 tablet (100 mcg total) by mouth daily. Alternate 88-100 mcg every other day 30 tablet 1   No current facility-administered medications for this visit.     Review of Systems Review of Systems  Constitutional: Negative.   Respiratory: Negative.   Cardiovascular: Negative.     Blood pressure 122/68, pulse 74, resp. rate 14, height 5' (1.524 m), weight 128 lb (58.1 kg).  Physical Exam Physical Exam  Constitutional: She is oriented to person, place, and time. She appears well-developed and well-nourished.  HENT:  Head:    Neurological: She is alert and oriented to person, place, and time.  Skin: Skin is warm and dry.  Psychiatric: Her behavior is normal.      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    Plan    The area should continue to heal spontaneously. Patient's family will continue to apply the medicated cream supplied by  the dermatologist.   Patient to return as needed.  HPI, Physical Exam, Assessment and Plan have been scribed under the direction and in the presence of Robert Bellow, MD.  Karie Fetch, RN I have completed the exam and reviewed the above documentation for accuracy and completeness.  I agree with the above.  Haematologist has been used and any errors in dictation or transcription are unintentional.  Hervey Ard, M.D., F.A.C.S.  Robert Bellow 03/05/2017, 9:54 AM

## 2017-03-05 NOTE — Patient Instructions (Addendum)
The patient is aware to call back for any questions or concerns. Patient to return as needed. 

## 2018-07-07 ENCOUNTER — Emergency Department (HOSPITAL_COMMUNITY): Payer: Medicare Other

## 2018-07-07 ENCOUNTER — Encounter (HOSPITAL_COMMUNITY): Payer: Self-pay | Admitting: Emergency Medicine

## 2018-07-07 ENCOUNTER — Other Ambulatory Visit: Payer: Self-pay

## 2018-07-07 ENCOUNTER — Emergency Department (HOSPITAL_COMMUNITY)
Admission: EM | Admit: 2018-07-07 | Discharge: 2018-07-07 | Disposition: A | Discharge status: Home / Self Care | Payer: Medicare Other | Attending: Emergency Medicine | Admitting: Emergency Medicine

## 2018-07-07 DIAGNOSIS — R531 Weakness: Secondary | ICD-10-CM | POA: Insufficient documentation

## 2018-07-07 DIAGNOSIS — I129 Hypertensive chronic kidney disease with stage 1 through stage 4 chronic kidney disease, or unspecified chronic kidney disease: Secondary | ICD-10-CM | POA: Insufficient documentation

## 2018-07-07 DIAGNOSIS — N184 Chronic kidney disease, stage 4 (severe): Secondary | ICD-10-CM | POA: Diagnosis not present

## 2018-07-07 DIAGNOSIS — R4182 Altered mental status, unspecified: Secondary | ICD-10-CM | POA: Diagnosis present

## 2018-07-07 DIAGNOSIS — Z79899 Other long term (current) drug therapy: Secondary | ICD-10-CM | POA: Diagnosis not present

## 2018-07-07 DIAGNOSIS — E039 Hypothyroidism, unspecified: Secondary | ICD-10-CM | POA: Insufficient documentation

## 2018-07-07 DIAGNOSIS — E875 Hyperkalemia: Secondary | ICD-10-CM | POA: Diagnosis not present

## 2018-07-07 DIAGNOSIS — R404 Transient alteration of awareness: Secondary | ICD-10-CM | POA: Insufficient documentation

## 2018-07-07 LAB — CBC
HEMATOCRIT: 36.3 % (ref 36.0–46.0)
HEMOGLOBIN: 11.6 g/dL — AB (ref 12.0–15.0)
MCH: 31.1 pg (ref 26.0–34.0)
MCHC: 32 g/dL (ref 30.0–36.0)
MCV: 97.3 fL (ref 78.0–100.0)
Platelets: 240 10*3/uL (ref 150–400)
RBC: 3.73 MIL/uL — ABNORMAL LOW (ref 3.87–5.11)
RDW: 13.8 % (ref 11.5–15.5)
WBC: 6.8 10*3/uL (ref 4.0–10.5)

## 2018-07-07 LAB — COMPREHENSIVE METABOLIC PANEL
ALK PHOS: 72 U/L (ref 38–126)
ALT: 18 U/L (ref 0–44)
AST: 21 U/L (ref 15–41)
Albumin: 3.7 g/dL (ref 3.5–5.0)
Anion gap: 8 (ref 5–15)
BILIRUBIN TOTAL: 0.8 mg/dL (ref 0.3–1.2)
BUN: 43 mg/dL — AB (ref 8–23)
CALCIUM: 9.6 mg/dL (ref 8.9–10.3)
CHLORIDE: 104 mmol/L (ref 98–111)
CO2: 27 mmol/L (ref 22–32)
CREATININE: 2.37 mg/dL — AB (ref 0.44–1.00)
GFR calc Af Amer: 19 mL/min — ABNORMAL LOW (ref >60)
GFR, EST NON AFRICAN AMERICAN: 16 mL/min — AB (ref >60)
Glucose, Bld: 107 mg/dL — ABNORMAL HIGH (ref 70–99)
Potassium: 5.4 mmol/L — ABNORMAL HIGH (ref 3.5–5.1)
Sodium: 139 mmol/L (ref 135–145)
Total Protein: 7.3 g/dL (ref 6.5–8.1)

## 2018-07-07 LAB — I-STAT CHEM 8, ED
BUN: 39 mg/dL — ABNORMAL HIGH (ref 8–23)
CALCIUM ION: 1.24 mmol/L (ref 1.15–1.40)
CHLORIDE: 104 mmol/L (ref 98–111)
CREATININE: 2.4 mg/dL — AB (ref 0.44–1.00)
GLUCOSE: 101 mg/dL — AB (ref 70–99)
HCT: 36 % (ref 36.0–46.0)
Hemoglobin: 12.2 g/dL (ref 12.0–15.0)
Potassium: 5.4 mmol/L — ABNORMAL HIGH (ref 3.5–5.1)
Sodium: 141 mmol/L (ref 135–145)
TCO2: 25 mmol/L (ref 22–32)

## 2018-07-07 LAB — URINALYSIS, ROUTINE W REFLEX MICROSCOPIC
Bilirubin Urine: NEGATIVE
GLUCOSE, UA: NEGATIVE mg/dL
HGB URINE DIPSTICK: NEGATIVE
KETONES UR: NEGATIVE mg/dL
NITRITE: NEGATIVE
PH: 6 (ref 5.0–8.0)
Protein, ur: 30 mg/dL — AB
Specific Gravity, Urine: 1.015 (ref 1.005–1.030)

## 2018-07-07 LAB — DIFFERENTIAL
BASOS ABS: 0 10*3/uL (ref 0.0–0.1)
BASOS PCT: 0 %
Eosinophils Absolute: 0.1 10*3/uL (ref 0.0–0.7)
Eosinophils Relative: 1 %
LYMPHS ABS: 2.1 10*3/uL (ref 0.7–4.0)
LYMPHS PCT: 31 %
MONO ABS: 0.4 10*3/uL (ref 0.1–1.0)
MONOS PCT: 6 %
NEUTROS ABS: 4.2 10*3/uL (ref 1.7–7.7)
Neutrophils Relative %: 62 %

## 2018-07-07 LAB — RAPID URINE DRUG SCREEN, HOSP PERFORMED
Amphetamines: NOT DETECTED
BARBITURATES: NOT DETECTED
BENZODIAZEPINES: POSITIVE — AB
Cocaine: NOT DETECTED
Opiates: NOT DETECTED
Tetrahydrocannabinol: NOT DETECTED

## 2018-07-07 LAB — I-STAT TROPONIN, ED: Troponin i, poc: 0.02 ng/mL (ref 0.00–0.08)

## 2018-07-07 LAB — APTT: APTT: 31 s (ref 24–36)

## 2018-07-07 LAB — ETHANOL

## 2018-07-07 LAB — PROTIME-INR
INR: 0.99
Prothrombin Time: 13 seconds (ref 11.4–15.2)

## 2018-07-07 NOTE — ED Provider Notes (Signed)
[facility] EMERGENCY DEPARTMENT Provider Note   CSN: [id number] Arrival date & time: 07/07/18  1010     History   Chief Complaint Chief Complaint  Patient presents with  . Altered Mental Status    HPI Amanda Prince is a 84 y.o. female.  Patient brought in by ambulance from [facility] [place].  She is 84 years old lives with family members.  Patient got up this morning and seemed to be normal 1 to her normal daily routine went to her chair to sit down family member came into check on her and they noted that she was drooling from the right side of the mouth and seemed to have weakness of the right arm but is not clear whether they tested her other extremities or her left arm.  Patient has a history of dementia.  Which this morning her level response and this was less.  Upon arrival here patient seen by me patient without any neurofocal deficit.  Was alert and would follow commands.  Certainly there is some degree of confusion.  Family members feel that she is back to her mental status baseline.  There was no facial droop there was no weakness of upper extremities or lower extremities.     Past Medical History:  Diagnosis Date  . Anxiety   . Arthritis   . Cancer ([place])    basal cell skin cancer  . HTN (hypertension)   . Kidney stone   . Seasonal allergies   . Thyroid disease   . Vertigo     Patient Active Problem List   Diagnosis Date Noted  . Facial basal cell cancer 01/22/2017  . Syncope 01/12/2017  . Systolic ejection murmur 93/71/6967  . Dehydration 12/07/2015  . AKI (acute kidney injury) ([place]) 12/07/2015  . CKD (chronic kidney disease) stage 4, GFR 15-29 ml/min ([facility]) 12/07/2015  . Sinusitis, acute 12/07/2015  . Postoperative anemia due to acute blood loss 08/04/2015  . Anemia, chronic disease 08/04/2015  . Renal insufficiency 08/04/2015  . Hip fracture, right ([place]) 07/18/2015  . Hypothyroidism 07/18/2015  . HTN (hypertension) 07/18/2015  . Hip fracture ([place])  07/18/2015  . Fracture of wrist 07/16/2011  . S/P wrist surgery 05/16/2011  . Wrist fracture, closed 05/08/2011  . Wrist fracture, open 05/08/2011    Past Surgical History:  Procedure Laterality Date  . GALLBLADDER SURGERY    . kidney stones    . ORIF HIP FRACTURE Right 07/19/2015   Procedure: OPEN REDUCTION INTERNAL FIXATION HIP;  Surgeon: Sanjuana Kava, MD;  Location: [facility];  Service: Orthopedics;  Laterality: Right;  . otif right wrist  Dr Aline Brochure 2012  . WRIST FRACTURE SURGERY       OB History    Gravida  7   Para      Term      Preterm      AB  1   Living        SAB      TAB      Ectopic  1   Multiple      Live Births  6        Obstetric Comments  1st Menstrual Cycle:   1st Pregnancy:            Home Medications    Prior to Admission medications   Medication Sig Start Date End Date Taking? Authorizing Provider  carvedilol (COREG) 25 MG tablet Take 25 mg by mouth 2 (two) times daily with a meal.  Yes [provider]  Cholecalciferol (D3-1000 PO) Take by mouth.   Yes [provider]  Cyanocobalamin (VITAMIN B12 PO) Take 1 tablet by mouth daily.    Yes [provider]  donepezil (ARICEPT) 10 MG tablet Take 1 tablet by mouth at bedtime.  11/20/16  Yes [provider]  LORazepam (ATIVAN) 0.5 MG tablet Take 1 tablet by mouth daily. 12/16/16  Yes [provider]  Melatonin-Pyridoxine (MELATONEX PO) Take by mouth.   Yes [provider]  memantine (NAMENDA) 10 MG tablet Take 10 mg by mouth 2 (two) times daily.   Yes [provider]  Omega-3 Fatty Acids (FISH OIL) 1000 MG CAPS Take by mouth.   Yes [provider]  Pyridoxine HCl (VITAMIN B6 PO) Take 1 tablet by mouth daily.    Yes [provider]  SYNTHROID 100 MCG tablet Take 1 tablet (100 mcg total) by mouth daily. Alternate 88-100 mcg every other day 01/14/17  Yes Orvan Falconer, MD    Family History Family History  Problem  Relation Age of Onset  . Heart disease Unknown     Social History Social History   Tobacco Use  . Smoking status: Never Smoker  . Smokeless tobacco: Never Used  Substance Use Topics  . Alcohol use: No  . Drug use: No     Allergies   Other; Epinephrine; and Sulfa antibiotics   Review of Systems Review of Systems  Unable to perform ROS: Dementia     Physical Exam Updated Vital Signs BP (!) 184/71 (BP Location: Right Arm)   Pulse 72   Temp 98 F (36.7 C) (Oral)   Resp 20   Ht 1.524 m (5')   Wt 58.1 kg   SpO2 94%   BMI 25.02 kg/m   Physical Exam  Constitutional: She appears well-developed and well-nourished. No distress.  HENT:  Head: Normocephalic and atraumatic.  Mouth/Throat: Oropharynx is clear and moist.  Eyes: Pupils are equal, round, and reactive to light. Conjunctivae and EOM are normal.  Neck: Normal range of motion. Neck supple.  Cardiovascular: Normal rate, regular rhythm and normal heart sounds.  Pulmonary/Chest: Effort normal and breath sounds normal. She has no wheezes. She has no rales.  Abdominal: Soft. Bowel sounds are normal. There is no tenderness.  Musculoskeletal: Normal range of motion.  Neurological: She is alert. No cranial nerve deficit or sensory deficit. She exhibits normal muscle tone.  Skin: Skin is warm.  Nursing note and vitals reviewed.    ED Treatments / Results  Labs (all labs ordered are listed, but only abnormal results are displayed) Labs Reviewed  CBC - Abnormal; Notable for the following components:      Result Value   RBC 3.73 (*)    Hemoglobin 11.6 (*)    All other components within normal limits  COMPREHENSIVE METABOLIC PANEL - Abnormal; Notable for the following components:   Potassium 5.4 (*)    Glucose, Bld 107 (*)    BUN 43 (*)    Creatinine, Ser 2.37 (*)    GFR calc non Af Amer 16 (*)    GFR calc Af Amer 19 (*)    All other components within normal limits  RAPID URINE DRUG SCREEN, HOSP PERFORMED -  Abnormal; Notable for the following components:   Benzodiazepines POSITIVE (*)    All other components within normal limits  URINALYSIS, ROUTINE W REFLEX MICROSCOPIC - Abnormal; Notable for the following components:   APPearance HAZY (*)    Protein, ur  30 (*)    Leukocytes, UA SMALL (*)    Bacteria, UA RARE (*)    All other components within normal limits  I-STAT CHEM 8, ED - Abnormal; Notable for the following components:   Potassium 5.4 (*)    BUN 39 (*)    Creatinine, Ser 2.40 (*)    Glucose, Bld 101 (*)    All other components within normal limits  ETHANOL  PROTIME-INR  APTT  DIFFERENTIAL  I-STAT TROPONIN, ED    EKG EKG Interpretation  Date/Time:  Tuesday July 07 2018 10:12:16 EDT Ventricular Rate:  74 PR Interval:    QRS Duration: 91 QT Interval:  388 QTC Calculation: 431 R Axis:   -43 Text Interpretation:  Sinus rhythm Borderline prolonged PR interval Left axis deviation Repol abnrm suggests ischemia, lateral leads Artifact Confirmed by Fredia Sorrow [phone]) on 07/07/2018 10:20:50 AM   Radiology Ct Head Wo Contrast  Result Date: 07/07/2018 CLINICAL DATA:  84 year old female with right arm weakness, drooling from the right mouth. Confused. EXAM: CT HEAD WITHOUT CONTRAST TECHNIQUE: Contiguous axial images were obtained from the base of the skull through the vertex without intravenous contrast. COMPARISON:  Head CT without contrast 01/12/2017 and earlier. FINDINGS: Brain: Stable cerebral volume since 2018. No midline shift, ventriculomegaly, mass effect, evidence of mass lesion, intracranial hemorrhage or evidence of cortically based acute infarction. Patchy and confluent bilateral cerebral white matter hypodensity is stable with some deep white matter capsule involvement. Vascular: Calcified atherosclerosis at the skull base. Chronic calcified right MCA atherosclerosis. No suspicious intracranial vascular hyperdensity. Skull: Stable, negative. Sinuses/Orbits: Visualized  paranasal sinuses and mastoids are stable and well pneumatized. Other: No acute orbit or scalp soft tissue findings. IMPRESSION: No acute intracranial abnormality, stable non contrast CT appearance of the brain since 2018. Electronically Signed   By: Genevie Ann M.D.   On: 07/07/2018 12:29   Mr Brain Wo Contrast (neuro Protocol)  Result Date: 07/07/2018 CLINICAL DATA:  Initial evaluation for acute right-sided weakness. EXAM: MRI HEAD WITHOUT CONTRAST TECHNIQUE: Multiplanar, multiecho pulse sequences of the brain and surrounding structures were obtained without intravenous contrast. COMPARISON:  Prior CT from earlier the same day. FINDINGS: Brain: Generalized age-related cerebral atrophy. Moderate chronic small vessel ischemic changes present within the periventricular and deep white matter both cerebral hemispheres. No abnormal foci of restricted diffusion to suggest acute or subacute ischemia. Gray-white matter differentiation maintained. No areas of remote cortical infarction. No acute or chronic intracranial hemorrhage. No mass lesion, midline shift or mass effect. Mild ventricular prominence related global parenchymal volume loss without hydrocephalus. No extra-axial fluid collection. Pituitary gland normal. Vascular: Major intracranial vascular flow voids are maintained at the skull base. Skull and upper cervical spine: Craniocervical junction normal. Degenerative spondylolysis noted at C2-3 with resultant mild spinal stenosis. Bone marrow signal intensity within normal limits. No scalp soft tissue abnormality. Sinuses/Orbits: Globes and orbital soft tissues within normal limits. Patient status post ocular lens replacement bilaterally. Paranasal sinuses are largely clear. Small left mastoid effusion, likely chronic and sterile. Inner ear structures normal. Other: None. IMPRESSION: 1. No acute intracranial abnormality. 2. Generalized age-related cerebral atrophy with moderate chronic small vessel ischemic  disease. Electronically Signed   By: Jeannine Boga M.D.   On: 07/07/2018 14:24   Dg Chest Port 1 View  Result Date: 07/07/2018 CLINICAL DATA:  Altered mental status. EXAM: PORTABLE CHEST 1 VIEW COMPARISON:  01/12/2017 FINDINGS: Slight tortuosity and calcification of the thoracic aorta. Overall heart size and pulmonary vascularity are normal.  Chronic elevation of the right hemidiaphragm. Interstitial markings are slightly accentuated bilaterally. I feel this is due to a shallow inspiration. No acute bone abnormality. IMPRESSION: No acute abnormalities. Chronic elevation of the right hemidiaphragm. Aortic Atherosclerosis (ICD10-I70.0). Electronically Signed   By: Lorriane Shire M.D.   On: 07/07/2018 10:54    Procedures Procedures (including critical care time)  Medications Ordered in ED Medications - No data to display   Initial Impression / Assessment and Plan / ED Course  I have reviewed the triage vital signs and the nursing notes.  Pertinent labs & imaging results that were available during my care of the patient were reviewed by me and considered in my medical decision making (see chart for details).     Patient's work-up here to include head CT and MRI brain without evidence of any acute stroke.  It is not clear whether patient even had a TIA.  Apparently patient got up and was fine I was going through her normal morning routine went to her chair was found by a person that stays with her to be not very responsive.  Was drooling there was some question of right facial droop and she seemed to have right arm weakness but it is not clear whether both arms were weak.  Upon arrival to the emergency department I saw her shortly after arrival all these things had resolved.  And patient was back to her normal mental status baseline.  Which is alert with dementia.  Patient is a DNR.  Work-up here lab wise no evidence of urinary tract infection chest x-ray negative already mentioned head  CT and MRI brain without any acute findings.  Patient does have elevated potassium and some renal insufficiency patient has had elevated potassiums before but today it is 5.4.  And creatinine is 2.4 which is in the range she has been in the past.   But the creatinine is slightly elevated here today compared to a year ago.  Patient has primary care doctor in the [place] area.  Family states they can get her seen for repeat lab work later this week.  Do not feel that patient has had a stroke question would remain whether there was a TIA but that is awful quick recovery for these symptoms that seem to be fairly significant.  It sounds more as if she had somewhat a very vasovagal may be type of event or a change in her level of consciousness of that and therefore had some degree of unresponsiveness.  Given patient's age and the work-up being negative do not feel that she warrants a hospitalization for possible TIA but can follow-up closely with her primary care provider for recheck of her labs.  Patient's blood pressure is also elevated here today patient apparently had an increase in her blood pressure medicine recently so it does appear that perhaps it was not related to hypotension.  No evidence of hypotension here.   Family is okay with patient being discharged home with close follow-up with primary care provider. Final Clinical Impressions(s) / ED Diagnoses   Final diagnoses:  Transient alteration of awareness  Hyperkalemia    ED Discharge Orders    None       Fredia Sorrow, MD 07/07/18 1552

## 2018-07-07 NOTE — ED Triage Notes (Addendum)
Family noticed at 0815 pt was drooling from right side of mouth and weakness and rt arm.  Also pt was was not talking or responding like usual.  Pt is confused which is baseline.  cbg 108 per ems.  Pt is awake and alert at this time.  No physical deficets observed.

## 2018-07-07 NOTE — Discharge Instructions (Addendum)
Make an appointment for lab work with her primary care doctor towards the end of this week particularly to check her potassium level again.  Return for any new or worse symptoms.  MRI today and CT and labs otherwise without significant abnormalities.  Lab work and CAT scan and x-ray results are attached to the back of your discharge paperwork.

## 2018-07-17 ENCOUNTER — Emergency Department (HOSPITAL_COMMUNITY)
Admission: EM | Admit: 2018-07-17 | Discharge: 2018-07-17 | Disposition: A | Discharge status: Home / Self Care | Payer: Medicare Other | Attending: Emergency Medicine | Admitting: Emergency Medicine

## 2018-07-17 ENCOUNTER — Other Ambulatory Visit: Payer: Self-pay

## 2018-07-17 ENCOUNTER — Emergency Department (HOSPITAL_COMMUNITY): Payer: Medicare Other

## 2018-07-17 ENCOUNTER — Encounter (HOSPITAL_COMMUNITY): Payer: Self-pay | Admitting: Emergency Medicine

## 2018-07-17 DIAGNOSIS — E039 Hypothyroidism, unspecified: Secondary | ICD-10-CM | POA: Diagnosis not present

## 2018-07-17 DIAGNOSIS — R531 Weakness: Secondary | ICD-10-CM | POA: Insufficient documentation

## 2018-07-17 DIAGNOSIS — E875 Hyperkalemia: Secondary | ICD-10-CM | POA: Diagnosis not present

## 2018-07-17 DIAGNOSIS — E860 Dehydration: Secondary | ICD-10-CM | POA: Insufficient documentation

## 2018-07-17 DIAGNOSIS — N390 Urinary tract infection, site not specified: Secondary | ICD-10-CM | POA: Diagnosis not present

## 2018-07-17 DIAGNOSIS — I129 Hypertensive chronic kidney disease with stage 1 through stage 4 chronic kidney disease, or unspecified chronic kidney disease: Secondary | ICD-10-CM | POA: Insufficient documentation

## 2018-07-17 DIAGNOSIS — Z79899 Other long term (current) drug therapy: Secondary | ICD-10-CM | POA: Insufficient documentation

## 2018-07-17 DIAGNOSIS — N184 Chronic kidney disease, stage 4 (severe): Secondary | ICD-10-CM | POA: Insufficient documentation

## 2018-07-17 LAB — URINALYSIS, ROUTINE W REFLEX MICROSCOPIC
BILIRUBIN URINE: NEGATIVE
Glucose, UA: NEGATIVE mg/dL
Hgb urine dipstick: NEGATIVE
Ketones, ur: NEGATIVE mg/dL
Nitrite: NEGATIVE
PROTEIN: NEGATIVE mg/dL
Specific Gravity, Urine: 1.012 (ref 1.005–1.030)
pH: 7 (ref 5.0–8.0)

## 2018-07-17 LAB — CBC WITH DIFFERENTIAL/PLATELET
BASOS ABS: 0 10*3/uL (ref 0.0–0.1)
Basophils Relative: 0 %
EOS ABS: 0.1 10*3/uL (ref 0.0–0.7)
Eosinophils Relative: 2 %
HCT: 35.4 % — ABNORMAL LOW (ref 36.0–46.0)
HEMOGLOBIN: 11.3 g/dL — AB (ref 12.0–15.0)
LYMPHS PCT: 23 %
Lymphs Abs: 2 10*3/uL (ref 0.7–4.0)
MCH: 30.7 pg (ref 26.0–34.0)
MCHC: 31.9 g/dL (ref 30.0–36.0)
MCV: 96.2 fL (ref 78.0–100.0)
Monocytes Absolute: 0.7 10*3/uL (ref 0.1–1.0)
Monocytes Relative: 8 %
NEUTROS ABS: 5.6 10*3/uL (ref 1.7–7.7)
Neutrophils Relative %: 67 %
PLATELETS: 219 10*3/uL (ref 150–400)
RBC: 3.68 MIL/uL — AB (ref 3.87–5.11)
RDW: 14 % (ref 11.5–15.5)
WBC: 8.4 10*3/uL (ref 4.0–10.5)

## 2018-07-17 LAB — COMPREHENSIVE METABOLIC PANEL
ALBUMIN: 3.4 g/dL — AB (ref 3.5–5.0)
ALK PHOS: 75 U/L (ref 38–126)
ALT: 17 U/L (ref 0–44)
AST: 20 U/L (ref 15–41)
Anion gap: 7 (ref 5–15)
BUN: 37 mg/dL — AB (ref 8–23)
CALCIUM: 9.3 mg/dL (ref 8.9–10.3)
CHLORIDE: 108 mmol/L (ref 98–111)
CO2: 25 mmol/L (ref 22–32)
CREATININE: 2.02 mg/dL — AB (ref 0.44–1.00)
GFR calc Af Amer: 22 mL/min — ABNORMAL LOW (ref >60)
GFR calc non Af Amer: 19 mL/min — ABNORMAL LOW (ref >60)
GLUCOSE: 90 mg/dL (ref 70–99)
Potassium: 5.5 mmol/L — ABNORMAL HIGH (ref 3.5–5.1)
Sodium: 140 mmol/L (ref 135–145)
Total Bilirubin: 0.6 mg/dL (ref 0.3–1.2)
Total Protein: 6.9 g/dL (ref 6.5–8.1)

## 2018-07-17 LAB — I-STAT CHEM 8, ED
BUN: 35 mg/dL — AB (ref 8–23)
CALCIUM ION: 1.17 mmol/L (ref 1.15–1.40)
CREATININE: 2.1 mg/dL — AB (ref 0.44–1.00)
Chloride: 109 mmol/L (ref 98–111)
GLUCOSE: 98 mg/dL (ref 70–99)
HCT: 32 % — ABNORMAL LOW (ref 36.0–46.0)
HEMOGLOBIN: 10.9 g/dL — AB (ref 12.0–15.0)
Potassium: 5.6 mmol/L — ABNORMAL HIGH (ref 3.5–5.1)
Sodium: 139 mmol/L (ref 135–145)
TCO2: 20 mmol/L — AB (ref 22–32)

## 2018-07-17 LAB — TROPONIN I: Troponin I: <0.03 ng/mL (ref <0.03)

## 2018-07-17 MED ORDER — CEPHALEXIN 500 MG PO CAPS: 500.0000 mg | ORAL_CAPSULE | Freq: Four times a day (QID) | ORAL | 0 refills | Status: AC

## 2018-07-17 MED ORDER — SODIUM CHLORIDE 0.9 % IV SOLN
INTRAVENOUS | Status: DC
  Administered 2018-07-17: 13:00:00 via INTRAVENOUS

## 2018-07-17 MED ORDER — SODIUM CHLORIDE 0.9 % IV SOLN
1.0000 g | Freq: Once | INTRAVENOUS | Status: AC
  Administered 2018-07-17: 1 g via INTRAVENOUS
  Filled 2018-07-17: qty 10

## 2018-07-17 MED ORDER — SODIUM POLYSTYRENE SULFONATE 15 GM/60ML PO SUSP
30.0000 g | Freq: Once | ORAL | Status: AC
  Administered 2018-07-17: 30 g via ORAL
  Filled 2018-07-17: qty 120

## 2018-07-17 NOTE — ED Provider Notes (Signed)
[facility] EMERGENCY DEPARTMENT Provider Note   CSN: [id number] Arrival date & time: 07/17/18  7322     History   Chief Complaint Chief Complaint  Patient presents with  . Weakness    HPI Amanda Prince is a 84 y.o. female.  The history is provided by the patient. No language interpreter was used.  Weakness  Primary symptoms comment: general weakness. This is a recurrent problem. The current episode started more than 1 week ago. The problem has not changed since onset.There was no focality noted. There has been no fever. Pertinent negatives include no shortness of breath. There were no medications administered prior to arrival. Associated medical issues do not include trauma or seizures.    Past Medical History:  Diagnosis Date  . Anxiety   . Arthritis   . Cancer ([place])    basal cell skin cancer  . HTN (hypertension)   . Kidney stone   . Seasonal allergies   . Thyroid disease   . Vertigo     Patient Active Problem List   Diagnosis Date Noted  . Facial basal cell cancer 01/22/2017  . Syncope 01/12/2017  . Systolic ejection murmur 02/54/2706  . Dehydration 12/07/2015  . AKI (acute kidney injury) ([place]) 12/07/2015  . CKD (chronic kidney disease) stage 4, GFR 15-29 ml/min (HCC) 12/07/2015  . Sinusitis, acute 12/07/2015  . Postoperative anemia due to acute blood loss 08/04/2015  . Anemia, chronic disease 08/04/2015  . Renal insufficiency 08/04/2015  . Hip fracture, right ([place]) 07/18/2015  . Hypothyroidism 07/18/2015  . HTN (hypertension) 07/18/2015  . Hip fracture ([place]) 07/18/2015  . Fracture of wrist 07/16/2011  . S/P wrist surgery 05/16/2011  . Wrist fracture, closed 05/08/2011  . Wrist fracture, open 05/08/2011    Past Surgical History:  Procedure Laterality Date  . GALLBLADDER SURGERY    . kidney stones    . ORIF HIP FRACTURE Right 07/19/2015   Procedure: OPEN REDUCTION INTERNAL FIXATION HIP;  Surgeon: Sanjuana Kava, MD;  Location: [facility];  Service:  Orthopedics;  Laterality: Right;  . otif right wrist  Dr Aline Brochure 2012  . WRIST FRACTURE SURGERY       OB History    Gravida  7   Para      Term      Preterm      AB  1   Living        SAB      TAB      Ectopic  1   Multiple      Live Births  6        Obstetric Comments  1st Menstrual Cycle:   1st Pregnancy:            Home Medications    Prior to Admission medications   Medication Sig Start Date End Date Taking? Authorizing Provider  carvedilol (COREG) 25 MG tablet Take 25 mg by mouth 2 (two) times daily with a meal.   Yes [provider]  Cholecalciferol (D3-1000 PO) Take by mouth.   Yes [provider]  Cyanocobalamin (VITAMIN B12 PO) Take 1 tablet by mouth daily.    Yes [provider]  donepezil (ARICEPT) 10 MG tablet Take 1 tablet by mouth at bedtime.  11/20/16  Yes [provider]  levothyroxine (SYNTHROID, LEVOTHROID) 125 MCG tablet Take 125 mcg by mouth daily before breakfast.   Yes [provider]  LORazepam (ATIVAN) 0.5 MG tablet Take 1 tablet by mouth daily. 12/16/16  Yes  [provider]  Melatonin-Pyridoxine (MELATONEX PO) Take 1 tablet by mouth at bedtime.    Yes [provider]  memantine (NAMENDA) 10 MG tablet Take 10 mg by mouth 2 (two) times daily.   Yes [provider]  Omega-3 Fatty Acids (FISH OIL) 1000 MG CAPS Take by mouth.   Yes [provider]  Pyridoxine HCl (VITAMIN B6 PO) Take 1 tablet by mouth daily.    Yes [provider]    Family History Family History  Problem Relation Age of Onset  . Heart disease Unknown     Social History Social History   Tobacco Use  . Smoking status: Never Smoker  . Smokeless tobacco: Never Used  Substance Use Topics  . Alcohol use: No  . Drug use: No     Allergies   Other; Epinephrine; and Sulfa antibiotics   Review of Systems Review of Systems  Respiratory: Negative for shortness of breath.     Neurological: Positive for weakness.  All other systems reviewed and are negative.    Physical Exam Updated Vital Signs BP 134/66   Pulse 66   Temp 98.5 F (36.9 C) (Oral)   Resp 18   Ht 5' (1.524 m)   Wt 58.1 kg   SpO2 94%   BMI 25.02 kg/m   Physical Exam  Constitutional: She is oriented to person, place, and time. She appears well-developed and well-nourished.  HENT:  Head: Normocephalic.  Right Ear: External ear normal.  Left Ear: External ear normal.  Eyes: Pupils are equal, round, and reactive to light. EOM are normal.  Neck: Normal range of motion.  Cardiovascular: Normal rate and regular rhythm.  Pulmonary/Chest: Effort normal.  Abdominal: Soft. She exhibits no distension.  Musculoskeletal: Normal range of motion.  Neurological: She is alert and oriented to person, place, and time.  Skin: Skin is warm.  Psychiatric: She has a normal mood and affect.  Nursing note and vitals reviewed.    ED Treatments / Results  Labs (all labs ordered are listed, but only abnormal results are displayed) Labs Reviewed  CBC WITH DIFFERENTIAL/PLATELET - Abnormal; Notable for the following components:      Result Value   RBC 3.68 (*)    Hemoglobin 11.3 (*)    HCT 35.4 (*)    All other components within normal limits  COMPREHENSIVE METABOLIC PANEL - Abnormal; Notable for the following components:   Potassium 5.5 (*)    BUN 37 (*)    Creatinine, Ser 2.02 (*)    Albumin 3.4 (*)    GFR calc non Af Amer 19 (*)    GFR calc Af Amer 22 (*)    All other components within normal limits  TROPONIN I  URINALYSIS, ROUTINE W REFLEX MICROSCOPIC    EKG None  Radiology Dg Chest 2 View  Result Date: 07/17/2018 CLINICAL DATA:  Weakness EXAM: CHEST - 2 VIEW COMPARISON:  07/07/2018 FINDINGS: Stable elevation of the right hemidiaphragm with right base atelectasis or scarring. Left lung clear. Heart is upper limits normal in size. No effusions or acute bony abnormality. Calcifications  throughout the aorta. IMPRESSION: Borderline heart size. Stable chronic elevation of the right hemidiaphragm with right base atelectasis or scarring. Electronically Signed   By: Rolm Baptise M.D.   On: 07/17/2018 10:34    Procedures Procedures (including critical care time)  Medications Ordered in ED Medications - No data to display   Initial Impression / Assessment and Plan / ED Course  I have reviewed  the triage vital signs and the nursing notes.  Pertinent labs & imaging results that were available during my care of the patient were reviewed by me and considered in my medical decision making (see chart for details).  Clinical Course as of Jul 17 1517  Fri Jul 17, 2018  1149 Potassium(!): 5.5 [LS]  1150 Comprehensive metabolic panel(!) [LS]    Clinical Course User Index [LS] Fransico Meadow, PA-C    Pt given kayexalate here.   Social work met with family and will obtain a wheelchair and hospital bed for pt.   I will start pt on keflex for uti.  I advised recheck with primary MD in 2-3 daysa   Final Clinical Impressions(s) / ED Diagnoses   Final diagnoses:  Urinary tract infection without hematuria, site unspecified  Dehydration  Hyperkalemia  Weakness    ED Discharge Orders         Ordered    cephALEXin (KEFLEX) 500 MG capsule  4 times daily     07/17/18 1519        An After Visit Summary was printed and given to the patient.   Sidney Ace 07/17/18 1521    Noemi Chapel, MD 07/18/18 [phone]

## 2018-07-17 NOTE — ED Provider Notes (Signed)
Patient is an elderly 84 year old female currently living at home, presents with generalized weakness, on my exam she is awake alert and talkative, interactive, following commands with a soft abdomen and no edema.  She does have a urinary tract infection, labs show chronic renal insufficiency, mild hyperkalemia, this will need to be rechecked.  Patient will need IV fluids, antibiotics and anticipate discharge.  Family is okay with this but request social work evaluation to help with some home health items.  Medical screening examination/treatment/procedure(s) were conducted as a shared visit with non-physician practitioner(s) and myself.  I personally evaluated the patient during the encounter.  Clinical Impression:   Final diagnoses:  Urinary tract infection without hematuria, site unspecified  Dehydration  Hyperkalemia  Weakness         Noemi Chapel, MD 07/18/18 [phone]

## 2018-07-17 NOTE — Care Management (Cosign Needed)
Patient suffers from arthritis which impairs their ability to perform daily activities like ambulating in the home.  A walker will not resolve  issue with performing activities of daily living. A wheelchair will allow patient to safely perform daily activities. Patient is not able to propel themselves in the home using a standard weight wheelchair due to weakness. Patient can self propel in the lightweight wheelchair.

## 2018-07-17 NOTE — Care Management Note (Signed)
Case Management Note  Patient Details  Name: ARIELA MOCHIZUKI MRN: [id number] Date of Birth: 1917/01/11      CM consult for DME needs. This is patients second ED visit in two weeks. Pt lives in her own home and is cared for by her two daughters and granddaghter Brandon. Pt is able to ambulate short distances using a walker and has become weaker over past month or two. Per daughter pt eats very little to begin with but has been eating less over the past couple weeks also, she is too tired to eat by the time she gets to the table. Pt has advanced dementia. MD feels pt is dehydrated, family concerned about CHF because of her SOB. CM discussed possibility of her Dementia progressing and how she will eat less and less which will result in increased weakness. Family desires to NOT keep bringing pt to ED. They are working the Palliative of [place]. CM will contact hospice facility and request they re-evaluate pt for appropriateness of hospice. family wants to cont to care for pt in the home. They have requested hospital bed and light weight wheelchair. Family has no preference of DME provider but "has heard good things about the one in [place]". Referral sent to [facility]. They are aware DME needs to be delivered today.                           Expected Discharge Plan:  Home/Self Care  In-House Referral:  NA  Discharge planning Services  CM Consult  Post Acute Care Choice:  Durable Medical Equipment Choice offered to:  Adult Children  DME Arranged:  Youth worker wheelchair with seat cushion, Hospital bed DME Agency:  [facility]  Status of Service:  Completed, signed off  If discussed at [employer] of Stay Meetings, dates discussed:    Additional Comments:  Sherald Barge, RN 07/17/2018, 2:12 PM

## 2018-07-17 NOTE — Care Management (Signed)
Patient Information   Contact person for deliver of DME Amanda Prince [phone]  Ht: 5' Wt: 58kg  Patient Name Amanda, Prince ([id number]) Sex Female DOB 1917/06/23  Room Bed  APA18 APA18  Patient Demographics   Address [address] [place] [address] Phone [phone] (Home)  Patient Ethnicity & Race   Ethnic Group Patient Race  Not Hispanic or Latino White or Caucasian  Emergency Contact(s)   Name Relation Home Work [place] Daughter [phone]  [phone]  Amanda Prince Daughter [phone]  [phone]  Documents on File    Status Date Received Description  Documents for the Patient  Meta Received 05/08/11   [place] E-Signature HIPAA Notice of [place] E-Signature HIPAA Notice of Privacy Spanish     Driver's License Not Received    Insurance Card Received 05/08/11   Advance Directives/Living Will/HCPOA/POA Not Received    [place] HIPAA NOTICE OF PRIVACY - Scanned  05/08/11   Insurance Card  05/08/11   Release of Information  05/11/11   [place] HIPAA NOTICE OF PRIVACY - Scanned  94/17/40   Financial Application Not Received    AMB Correspondence  07/20/11 08/12 [facility]o [facility]  AMB Correspondence  08/03/11 08/12 Eval/POC Acc PT   AMB Correspondence  08/11/11 09/12 Office Note [facility]   Other Photo ID Received 12/31/16 [place] ID  Insurance Card Received 12/31/16 Medicare  ASA/MTH 2018  Insurance Card     HIM ROI Authorization (Expired) 12/18/15 D/C Summary for continuity of care.  Release of Information  12/19/15   Insurance Card Received 01/31/16   Insurance Card Received 01/01/17 AARP ASA/MTH 2018  [place] E-Signature HIPAA Notice of Privacy     [place] E-Signature HIPAA Notice of Privacy Signed 01/01/17   Advanced Beneficiary Notice (ABN) Not Received    E-Signature AOB Spanish Not Received    Release of Information Received 01/22/17 ASA/MTH DPR SIGNED  01-21-17 FOR [facility]  AMB Correspondence  01/21/17 LETTER/OFFICE NOTES SZULECKI MD, J  Insurance Card Received 02/11/17 ASA MTH AARP 2018  HIM ROI Authorization (Expired) 02/19/17 Faxed notes from 01/23/17, 01/28/17, 02/11/17 for continuing care  AMB Correspondence (Deleted) 08/03/11 08/12 Eval/POC Acc PT   Documents for the Encounter  AOB (Assignment of Insurance Benefits) Not Received    E-signature AOB Signed 07/17/18   MEDICARE RIGHTS Not Received    E-signature Medicare Rights Signed 07/17/18   EMS Run Sheet Received 07/17/18   Admission Information   Attending Provider Admitting Provider Admission Type Admission Date/Time  Noemi Chapel, MD  Emergency 07/17/18 0930  Discharge Date Hospital Service Auth/Cert Status Service Area   Emergency Medicine Incomplete [facility]  Unit Room/Bed Admission Status   [facility]PT APA18/APA18 Admission (Confirmed)   Admission   Complaint  [facility] Account   Name Acct ID Class Status Primary Coverage  Amanda, Prince [id number] Emergency Open MEDICARE - MEDICARE PART A AND B      Guarantor Account (for Hospital Account [id number])   Name Relation to Pt Service Area Active? Acct Type  Amanda Prince Self CHSA Yes Personal/Family  Address Phone    [place], [place] [address] [phone])        Coverage Information (for Hospital Account [id number])   1. MEDICARE/MEDICARE PART A AND B   F/O Payor/Plan Precert #  MEDICARE/MEDICARE PART A AND B   Subscriber Subscriber #  W[place]ver, Aleksis  Jacinto Reap [id number]  Address Phone  [address], [place] [address]   2. AARP/AARP   F/O Payor/Plan Precert #  AARP/AARP   Subscriber Subscriber #  Amanda, Prince [id number]  Address Phone  [id number] [place] [address] [phone]

## 2018-07-17 NOTE — Care Management (Cosign Needed)
Patient requires frequent re-positioning of the body in ways that cannot be achieved with an ordinary bed or wedge pillow, to eliminate pain, reduce pressure, and the head of the bed to be elevated more than 30 degrees most of the time due to arthritis

## 2018-07-17 NOTE — Discharge Instructions (Signed)
See your Physician for recheck in 2-3 days  

## 2018-07-17 NOTE — ED Notes (Signed)
Lab at bedside

## 2018-07-17 NOTE — ED Triage Notes (Signed)
Per EMS, pt was evaluated for weakness and hyperkalemia x2 weeks ago. Pt reports continued generalized weakness. nad noted. Pt alert, airway patent at time of arrival.

## 2018-07-19 LAB — URINE CULTURE

## 2018-10-25 DEATH — deceased

## 2020-07-19 IMAGING — CT CT HEAD W/O CM
3 series · 15 of 47 positions shown, 18 images · non-contrast
Comparison: Head CT without contrast 01/12/2017 and earlier.

CLINICAL DATA: [AGE] female with right arm weakness, drooling
from the right mouth. Confused.

EXAM:
CT HEAD WITHOUT CONTRAST
TECHNIQUE: Contiguous axial images were obtained from the base of the skull
through the vertex without intravenous contrast.

[Series 2: head wo · axial · 0.41mm/px · z∈[+37,+162]mm · 9 of 30 slices shown, 12 images]
[im 3/30  brain]
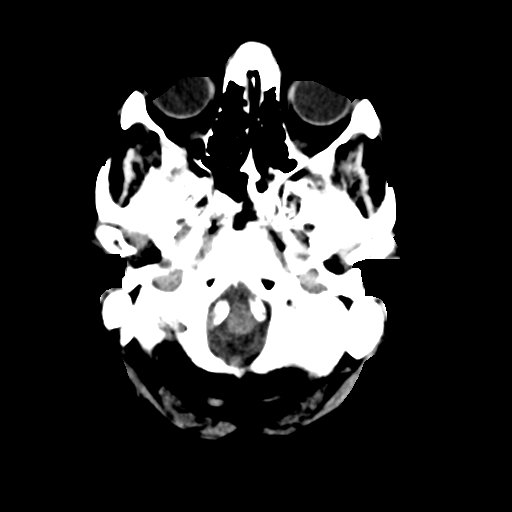
[im 3/30  bone]
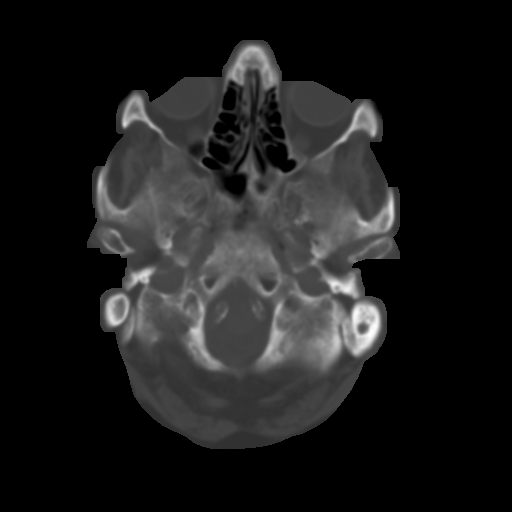
[im 6/30  brain]
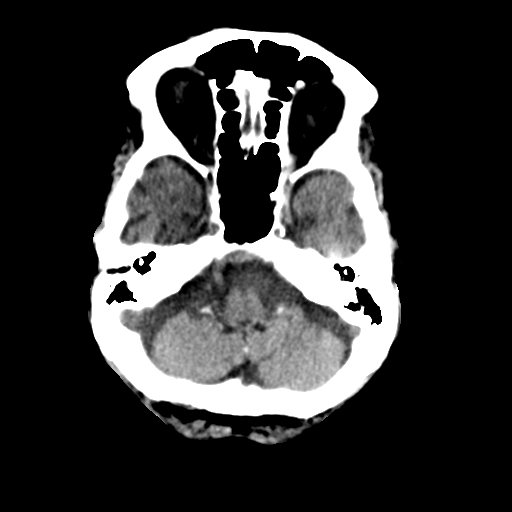
[im 9/30  brain]
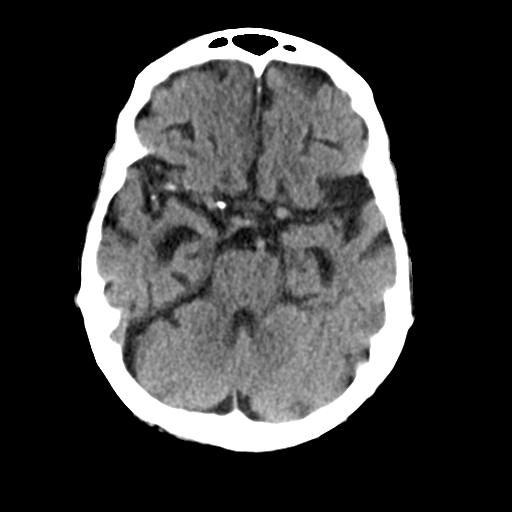
[im 12/30  brain]
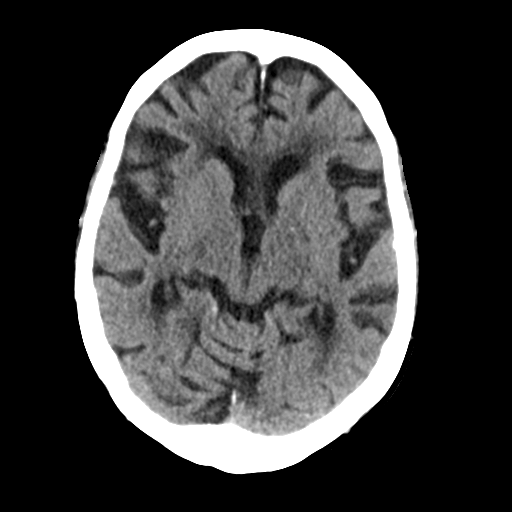
[im 16/30  brain]
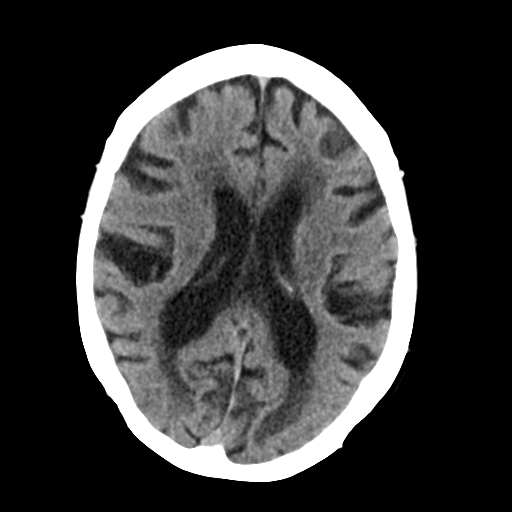
[im 16/30  bone]
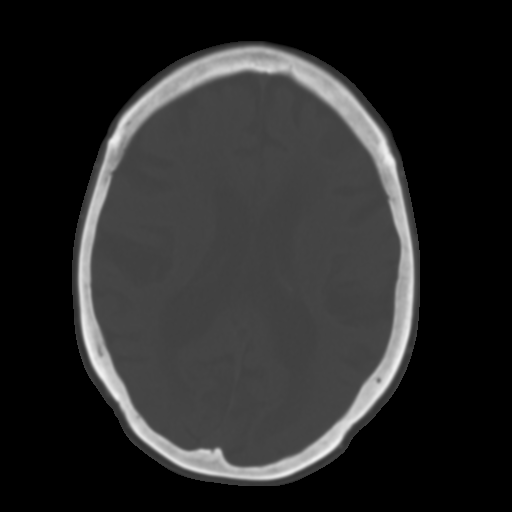
[im 19/30  brain]
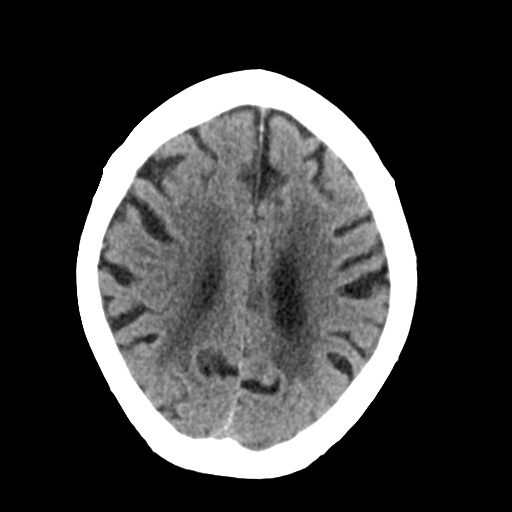
[im 22/30  brain]
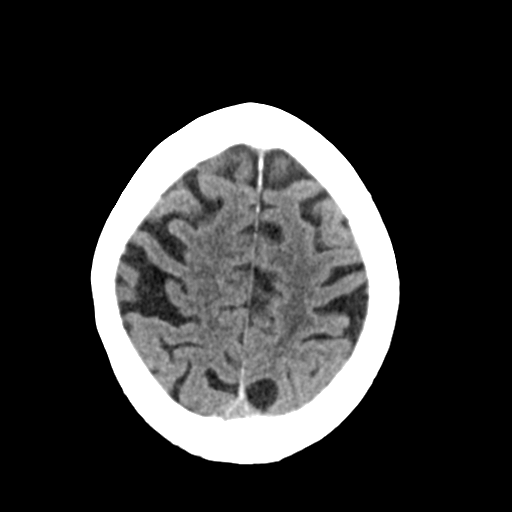
[im 25/30  brain]
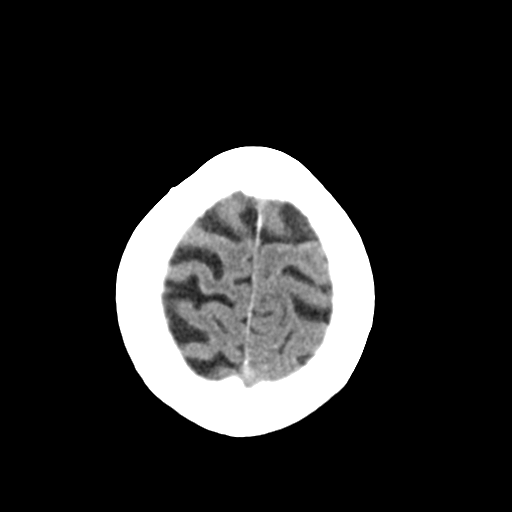
[im 28/30  brain]
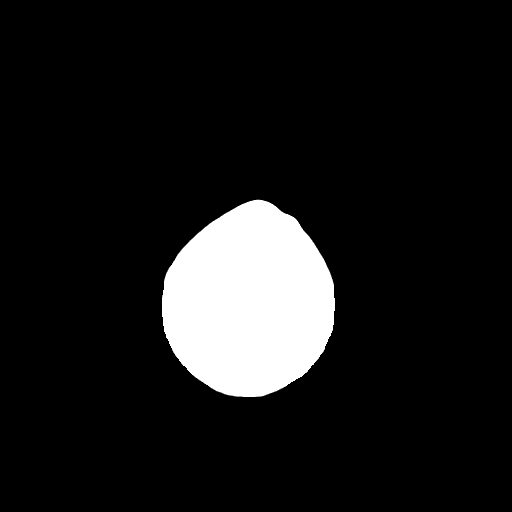
[im 28/30  bone]
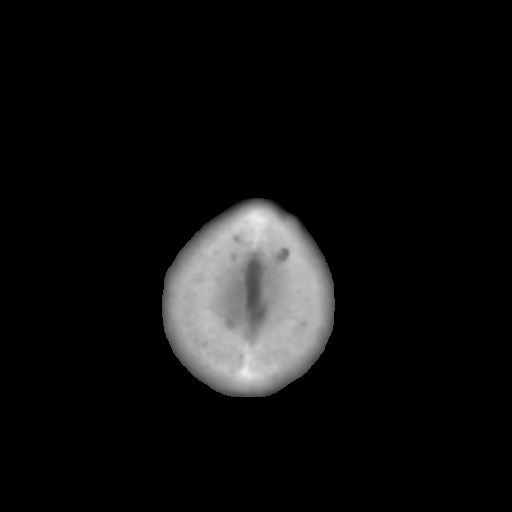

[Series 4: coronal soft tissue · coronal · 0.30mm/px · 3 of 67 slices shown]
[im 23/67  brain]
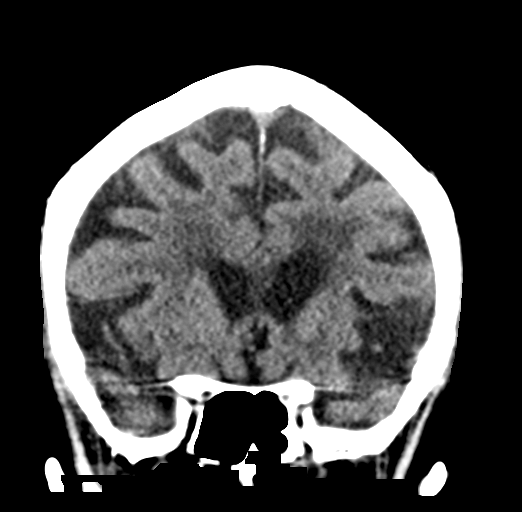
[im 30/67  brain]
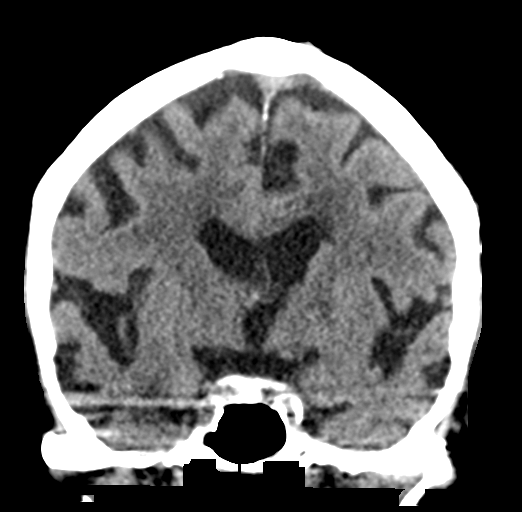
[im 37/67  brain]
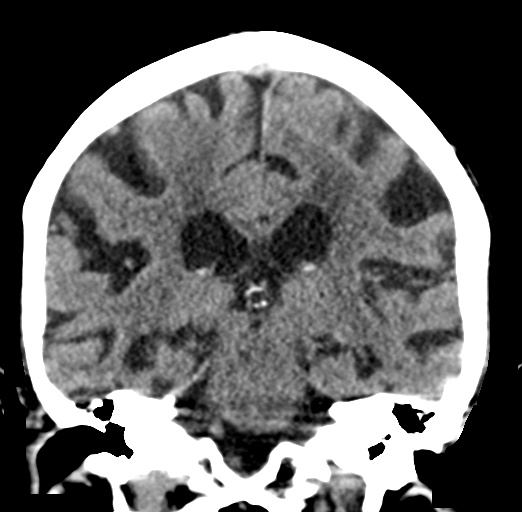

[Series 5: sagittal soft tissue · sagittal · 0.31mm/px · 3 of 52 slices shown]
[im 18/52  brain]
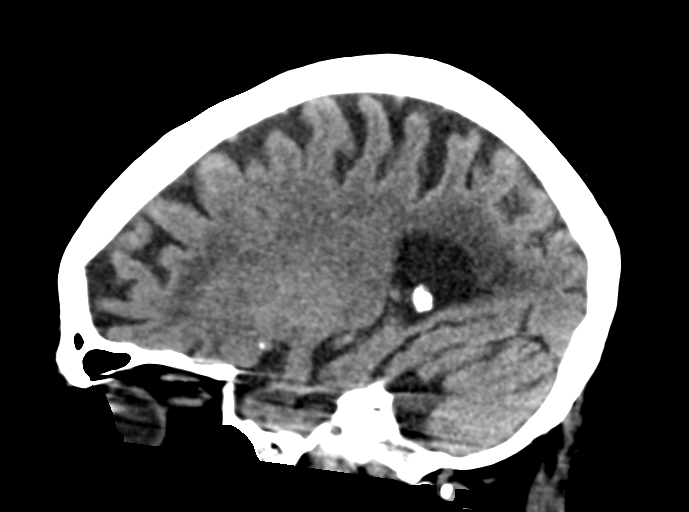
[im 26/52  brain]
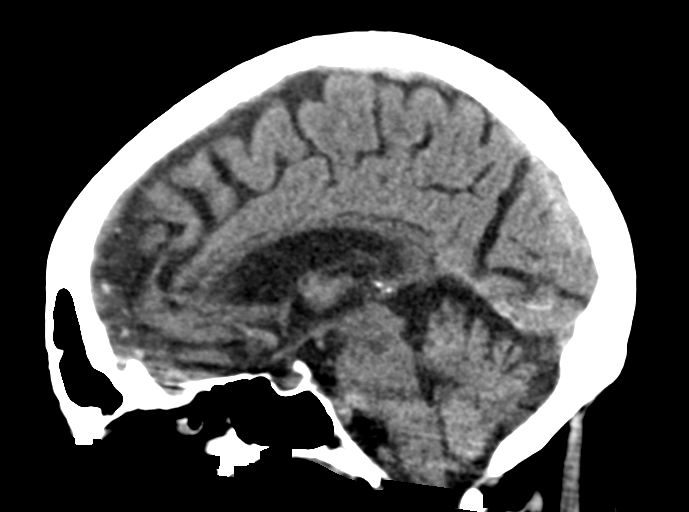
[im 35/52  brain]
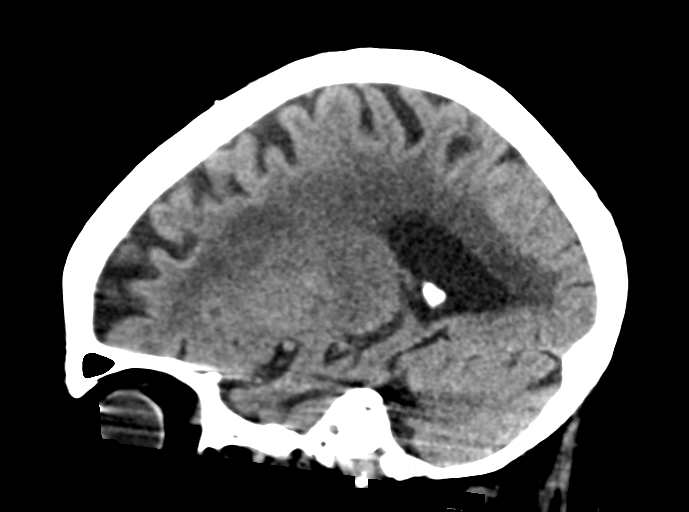

[15 of 47 positions shown; findings below may reference images not displayed]

FINDINGS: Brain: Stable cerebral volume since 4731.

No midline shift, ventriculomegaly, mass effect, evidence of mass
lesion, intracranial hemorrhage or evidence of cortically based
acute infarction.

Patchy and confluent bilateral cerebral white matter hypodensity is
stable with some deep white matter capsule involvement.

Vascular: Calcified atherosclerosis at the skull base. Chronic
calcified right MCA atherosclerosis. No suspicious intracranial
vascular hyperdensity.

Skull: Stable, negative.

Sinuses/Orbits: Visualized paranasal sinuses and mastoids are stable
and well pneumatized.

Other: No acute orbit or scalp soft tissue findings.
IMPRESSION: No acute intracranial abnormality, stable non contrast CT appearance
of the brain since [DATE].

## 2020-07-29 IMAGING — DX DG CHEST 2V
2 series · 2 of 2 positions shown · non-contrast
Comparison: 07/07/2018

CLINICAL DATA: Weakness

EXAM:
CHEST - 2 VIEW

[chest pa]
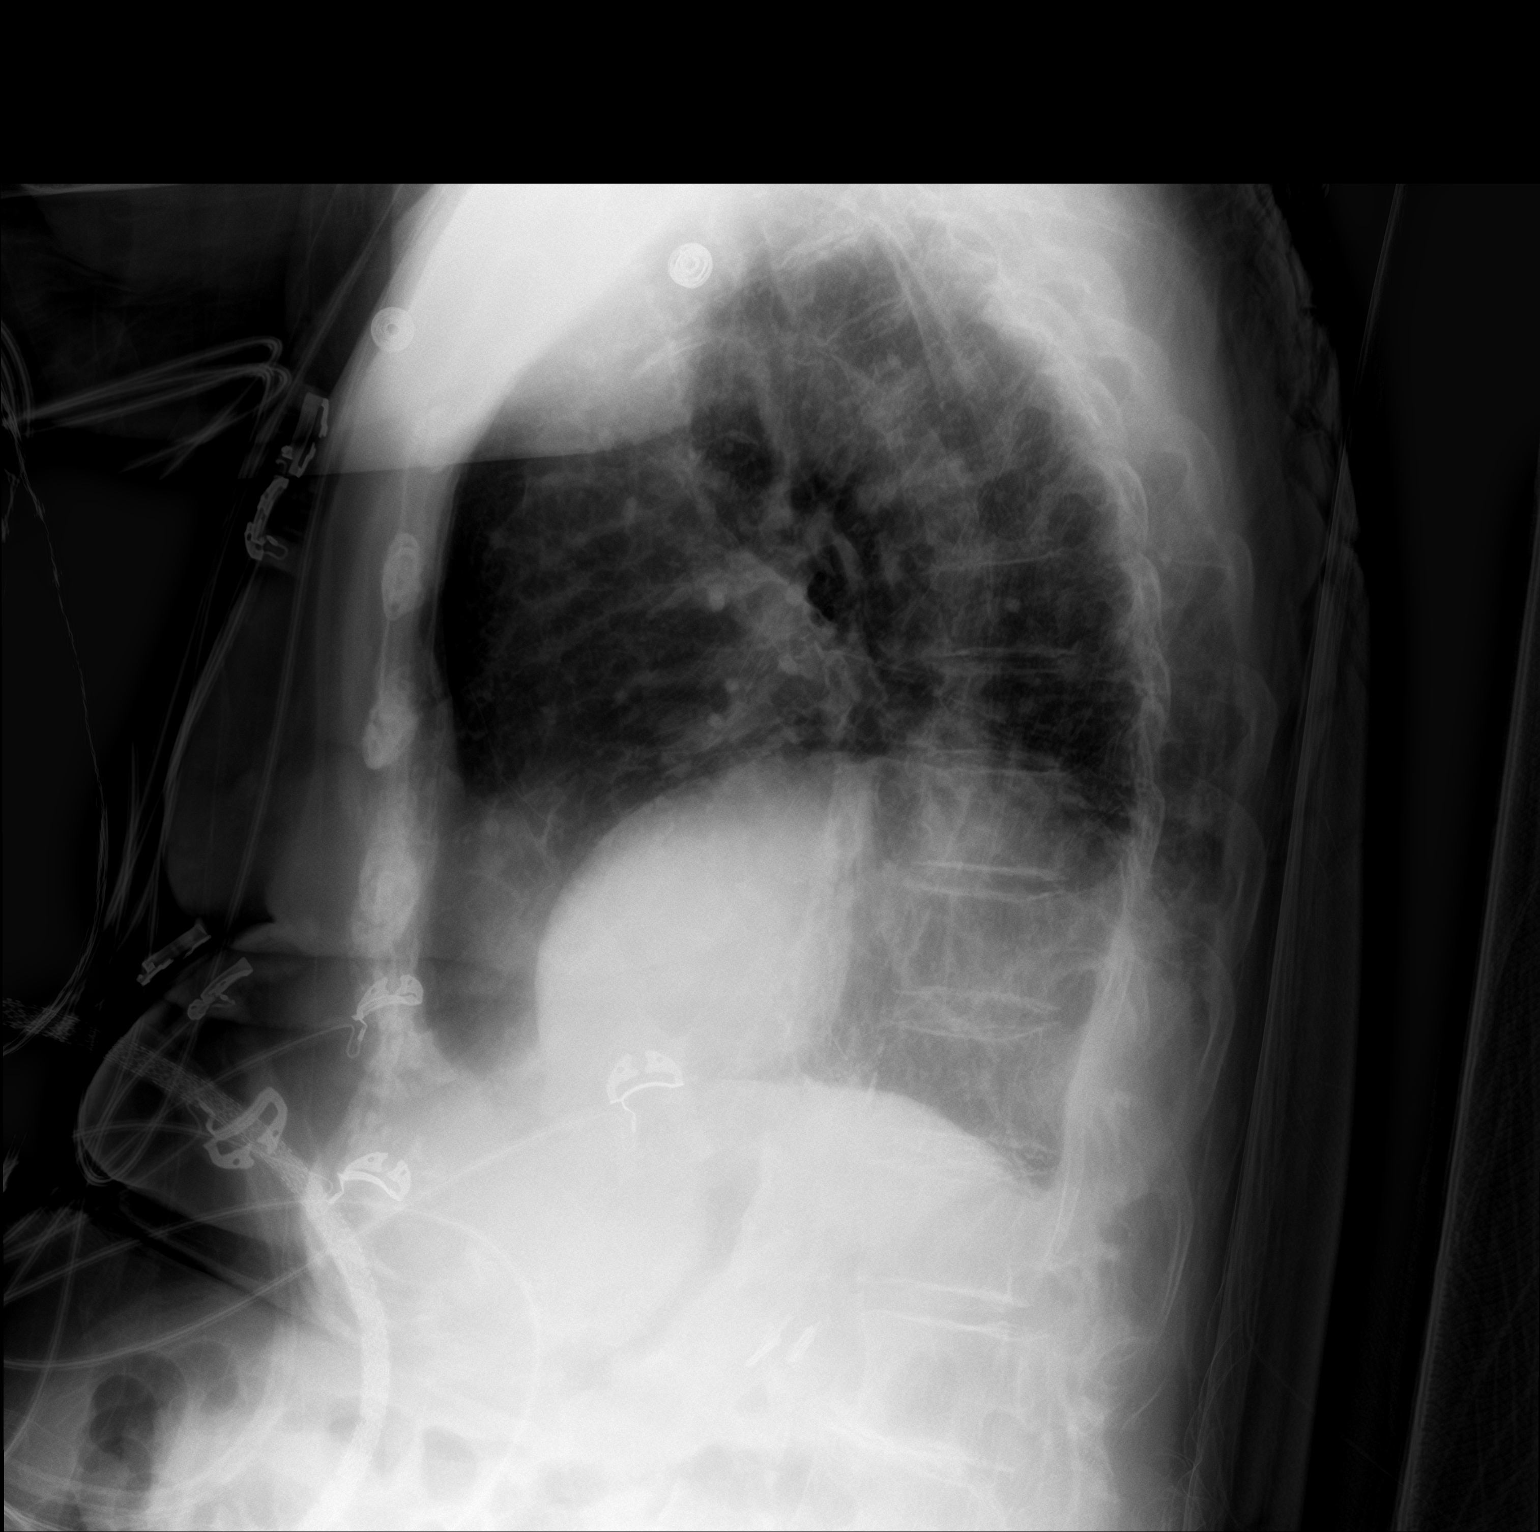

[chest ap]
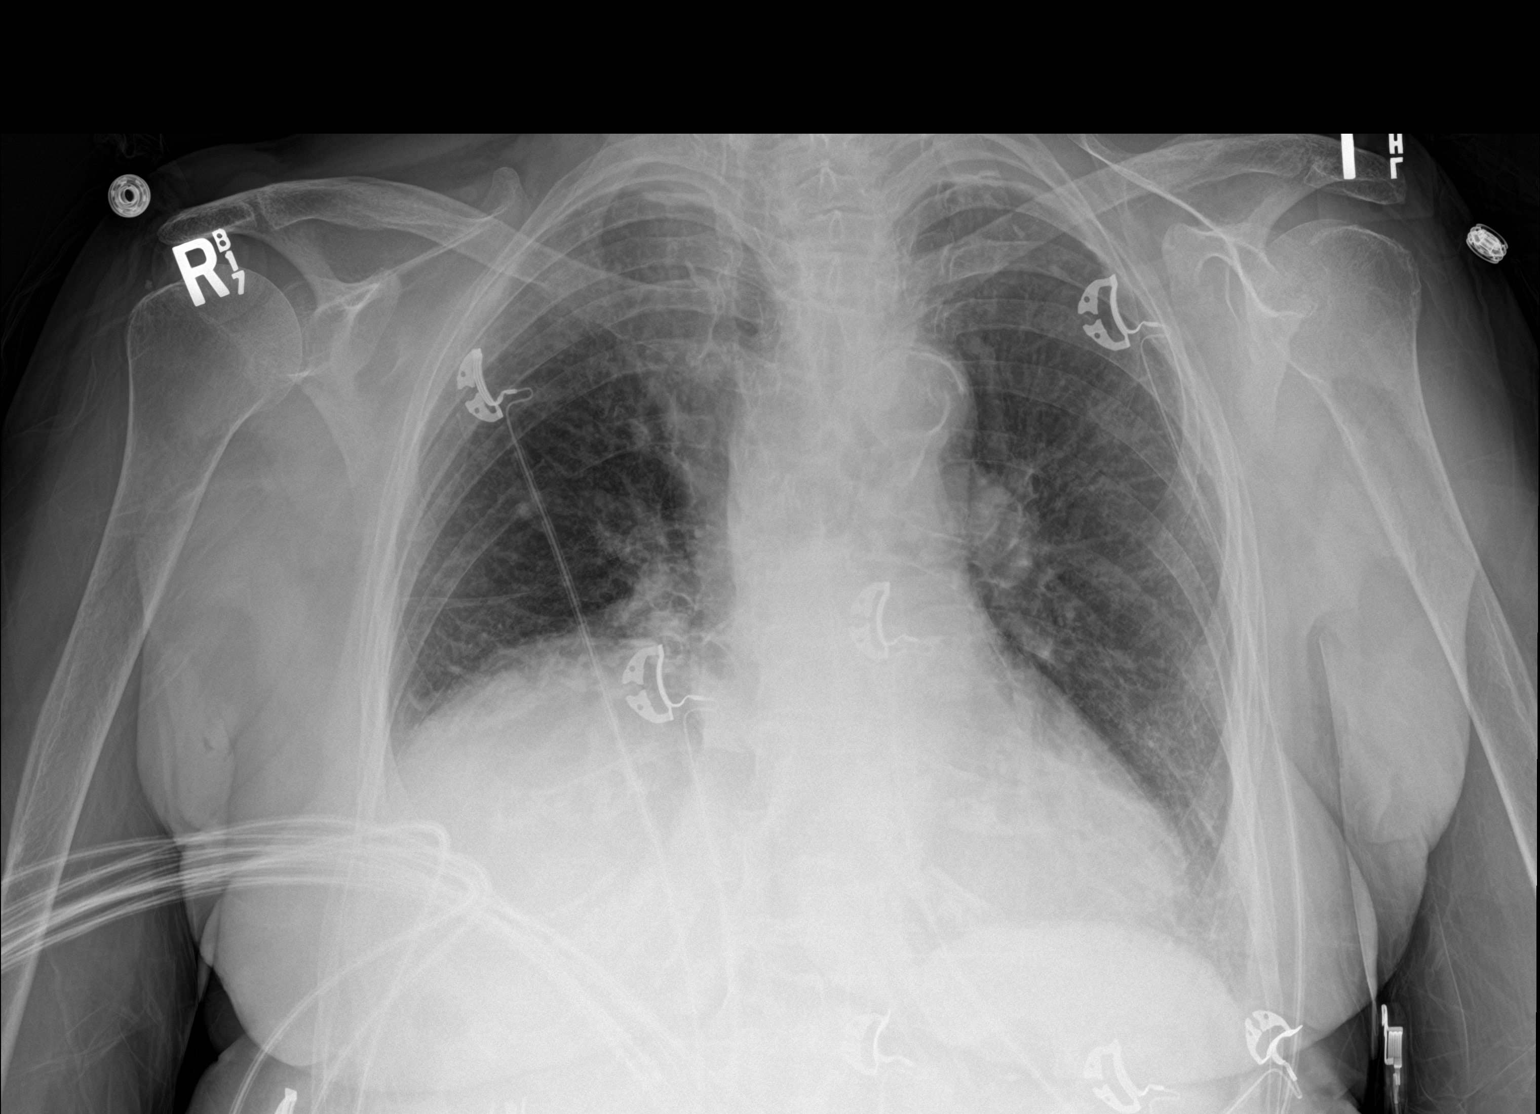

[2 of 2 positions shown; findings below may reference images not displayed]

FINDINGS: Stable elevation of the right hemidiaphragm with right base
atelectasis or scarring. Left lung clear. Heart is upper limits
normal in size. No effusions or acute bony abnormality.
Calcifications throughout the aorta.
IMPRESSION: Borderline heart size.

Stable chronic elevation of the right hemidiaphragm with right base
atelectasis or scarring.
